# Patient Record
Sex: Female | Born: 1960 | Race: White | Hispanic: No | Marital: Single | Smoking: Never smoker
Health system: Southern US, Community
[De-identification: ages and names within clinical notes are randomized; demographics above are authoritative.]

## PROBLEM LIST (undated history)

## (undated) DIAGNOSIS — R319 Hematuria, unspecified: Secondary | ICD-10-CM

## (undated) DIAGNOSIS — R350 Frequency of micturition: Secondary | ICD-10-CM

## (undated) DIAGNOSIS — N950 Postmenopausal bleeding: Secondary | ICD-10-CM

## (undated) DIAGNOSIS — R232 Flushing: Secondary | ICD-10-CM

## (undated) DIAGNOSIS — E119 Type 2 diabetes mellitus without complications: Secondary | ICD-10-CM

## (undated) DIAGNOSIS — T7840XA Allergy, unspecified, initial encounter: Secondary | ICD-10-CM

## (undated) DIAGNOSIS — R569 Unspecified convulsions: Secondary | ICD-10-CM

## (undated) DIAGNOSIS — U071 COVID-19: Secondary | ICD-10-CM

## (undated) HISTORY — DX: COVID-19: U07.1

## (undated) HISTORY — DX: Postmenopausal bleeding: N95.0

## (undated) HISTORY — DX: Allergy, unspecified, initial encounter: T78.40XA

## (undated) HISTORY — PX: BLADDER SURGERY: SHX569

## (undated) HISTORY — DX: Frequency of micturition: R35.0

## (undated) HISTORY — DX: Hematuria, unspecified: R31.9

## (undated) HISTORY — DX: Flushing: R23.2

## (undated) HISTORY — PX: COLONOSCOPY: SHX174

## (undated) HISTORY — DX: Unspecified convulsions: R56.9

## (undated) HISTORY — PX: WISDOM TOOTH EXTRACTION: SHX21

## (undated) HISTORY — DX: Type 2 diabetes mellitus without complications: E11.9

## (undated) HISTORY — PX: TONSILLECTOMY: SUR1361

---

## 2001-03-05 ENCOUNTER — Other Ambulatory Visit: Admission: RE | Admit: 2001-03-05 | Discharge: 2001-03-05 | Payer: Self-pay | Admitting: Obstetrics and Gynecology

## 2007-08-05 ENCOUNTER — Other Ambulatory Visit: Admission: RE | Admit: 2007-08-05 | Discharge: 2007-08-05 | Payer: Self-pay | Admitting: Obstetrics and Gynecology

## 2007-08-13 ENCOUNTER — Ambulatory Visit (HOSPITAL_COMMUNITY): Admission: RE | Admit: 2007-08-13 | Discharge: 2007-08-13 | Payer: Self-pay | Admitting: Obstetrics and Gynecology

## 2008-02-05 ENCOUNTER — Emergency Department (HOSPITAL_COMMUNITY): Admission: EM | Admit: 2008-02-05 | Discharge: 2008-02-05 | Payer: Self-pay | Admitting: Emergency Medicine

## 2010-09-20 ENCOUNTER — Other Ambulatory Visit (HOSPITAL_COMMUNITY)
Admission: RE | Admit: 2010-09-20 | Discharge: 2010-09-20 | Disposition: A | Discharge status: Home / Self Care | Payer: BC Managed Care – PPO | Source: Ambulatory Visit | Attending: Obstetrics and Gynecology | Admitting: Obstetrics and Gynecology

## 2010-09-20 DIAGNOSIS — Z01419 Encounter for gynecological examination (general) (routine) without abnormal findings: Secondary | ICD-10-CM | POA: Insufficient documentation

## 2012-12-10 ENCOUNTER — Telehealth: Payer: Self-pay | Admitting: Obstetrics & Gynecology

## 2012-12-10 NOTE — Telephone Encounter (Signed)
Please call patient and tell her to bring her labs with her, but neither is a major issue.  We will address at that appointment.

## 2012-12-10 NOTE — Telephone Encounter (Signed)
Spoke with pt. Had labs done through work in Feb and just got results back. Has a physical scheduled with Dr. Despina Hidden July 24. Advised to bring the results with her to appt, but pt is anxious and wants to know if she can know something before then. Pt questioning ?GFR 71 factor and TSH elevated. What do you advise?

## 2012-12-10 NOTE — Telephone Encounter (Signed)
Left message telling pt to bring labs with her, and that neither is a major issue. Will discuss further at next appt. JSY

## 2012-12-25 ENCOUNTER — Encounter: Payer: Self-pay | Admitting: Obstetrics & Gynecology

## 2012-12-25 ENCOUNTER — Other Ambulatory Visit (HOSPITAL_COMMUNITY)
Admission: RE | Admit: 2012-12-25 | Discharge: 2012-12-25 | Disposition: A | Discharge status: Home / Self Care | Payer: BC Managed Care – PPO | Source: Ambulatory Visit | Attending: Obstetrics & Gynecology | Admitting: Obstetrics & Gynecology

## 2012-12-25 ENCOUNTER — Ambulatory Visit (INDEPENDENT_AMBULATORY_CARE_PROVIDER_SITE_OTHER): Payer: BC Managed Care – PPO | Admitting: Obstetrics & Gynecology

## 2012-12-25 VITALS — BP 90/60 | Ht 64.4 in | Wt 126.0 lb

## 2012-12-25 DIAGNOSIS — Z01419 Encounter for gynecological examination (general) (routine) without abnormal findings: Secondary | ICD-10-CM | POA: Insufficient documentation

## 2012-12-25 DIAGNOSIS — E039 Hypothyroidism, unspecified: Secondary | ICD-10-CM

## 2012-12-25 DIAGNOSIS — Z1151 Encounter for screening for human papillomavirus (HPV): Secondary | ICD-10-CM | POA: Insufficient documentation

## 2012-12-25 MED ORDER — ESTRADIOL 0.52 MG/0.87 GM (0.06%) TD GEL: 2.0000 "application " | Freq: Every day | TRANSDERMAL | Status: DC

## 2012-12-25 MED ORDER — MIRABEGRON ER 25 MG PO TB24: 25.0000 mg | ORAL_TABLET | Freq: Every day | ORAL | Status: DC

## 2012-12-25 MED ORDER — PROGESTERONE MICRONIZED 200 MG PO CAPS: ORAL_CAPSULE | ORAL | Status: DC

## 2012-12-25 NOTE — Addendum Note (Signed)
Addended by: Colen Darling on: 12/25/2012 03:33 PM   Modules accepted: Orders

## 2012-12-25 NOTE — Patient Instructions (Signed)
Mammogram [phone]  Colonoscopy A colonoscopy is an exam to evaluate your entire colon. In this exam, your colon is cleansed. A long fiberoptic tube is inserted through your rectum and into your colon. The fiberoptic scope (endoscope) is a long bundle of enclosed and very flexible fibers. These fibers transmit light to the area examined and send images from that area to your caregiver. Discomfort is usually minimal. You may be given a drug to help you sleep (sedative) during or prior to the procedure. This exam helps to detect lumps (tumors), polyps, inflammation, and areas of bleeding. Your caregiver may also take a small piece of tissue (biopsy) that will be examined under a microscope. LET YOUR CAREGIVER KNOW ABOUT:   Allergies to food or medicine.  Medicines taken, including vitamins, herbs, eyedrops, over-the-counter medicines, and creams.  Use of steroids (by mouth or creams).  Previous problems with anesthetics or numbing medicines.  History of bleeding problems or blood clots.  Previous surgery.  Other health problems, including diabetes and kidney problems.  Possibility of pregnancy, if this applies. BEFORE THE PROCEDURE   A clear liquid diet may be required for 2 days before the exam.  Ask your caregiver about changing or stopping your regular medications.  Liquid injections (enemas) or laxatives may be required.  A large amount of electrolyte solution may be given to you to drink over a short period of time. This solution is used to clean out your colon.  You should be present 60 minutes prior to your procedure or as directed by your caregiver. AFTER THE PROCEDURE   If you received a sedative or pain relieving medication, you will need to arrange for someone to drive you home.  Occasionally, there is a little blood passed with the first bowel movement. Do not be concerned. FINDING OUT THE RESULTS OF YOUR TEST Not all test results are available during your visit. If  your test results are not back during the visit, make an appointment with your caregiver to find out the results. Do not assume everything is normal if you have not heard from your caregiver or the medical facility. It is important for you to follow up on all of your test results. HOME CARE INSTRUCTIONS   It is not unusual to pass moderate amounts of gas and experience mild abdominal cramping following the procedure. This is due to air being used to inflate your colon during the exam. Walking or a warm pack on your belly (abdomen) may help.  You may resume all normal meals and activities after sedatives and medicines have worn off.  Only take over-the-counter or prescription medicines for pain, discomfort, or fever as directed by your caregiver. Do not use aspirin or blood thinners if a biopsy was taken. Consult your caregiver for medicine usage if biopsies were taken. SEEK IMMEDIATE MEDICAL CARE IF:   You have a fever.  You pass large blood clots or fill a toilet with blood following the procedure. This may also occur 10 to 14 days following the procedure. This is more likely if a biopsy was taken.  You develop abdominal pain that keeps getting worse and cannot be relieved with medicine. Document Released: 05/18/2000 Document Revised: 08/13/2011 Document Reviewed: 01/01/2008 [facility] Patient Information 2014 [place], [place].

## 2012-12-25 NOTE — Progress Notes (Signed)
Patient ID: Patricia Huerta, female   DOB: Sep 14, 1960, 52 y.o.   MRN: [id number] Subjective:     Patricia Huerta is a 52 y.o. female here for a routine exam.  No LMP recorded. Patient is postmenopausal. No obstetric history on file. Current complaints: perimenopausal symptoms.  Personal health questionnaire reviewed: no.   Gynecologic History No LMP recorded. Patient is postmenopausal. Contraception: none Last Pap: 2013. Results were: normal Last mammogram: 2009. Results were: normal  Obstetric HistoryG0P0 OB History   Grav Para Term Preterm Abortions TAB SAB Ect Mult Living                   The following portions of the patient's history were reviewed and updated as appropriate: allergies, current medications, past family history, past medical history, past social history, past surgical history and problem list.  Review of Systems  Review of Systems  Constitutional: Negative for fever, chills, weight loss, malaise/fatigue and diaphoresis.  HENT: Negative for hearing loss, ear pain, nosebleeds, congestion, sore throat, neck pain, tinnitus and ear discharge.   Eyes: Negative for blurred vision, double vision, photophobia, pain, discharge and redness.  Respiratory: Negative for cough, hemoptysis, sputum production, shortness of breath, wheezing and stridor.   Cardiovascular: Negative for chest pain, palpitations, orthopnea, claudication, leg swelling and PND.  Gastrointestinal: negative for abdominal pain. Negative for heartburn, nausea, vomiting, diarrhea, constipation, blood in stool and melena.  Genitourinary: Negative for dysuria, urgency, frequency, hematuria and flank pain.  Musculoskeletal: Negative for myalgias, back pain, joint pain and falls.  Skin: Negative for itching and rash.  Neurological: Negative for dizziness, tingling, tremors, sensory change, speech change, focal weakness, seizures, loss of consciousness, weakness and headaches.  Endo/Heme/Allergies: Negative for  environmental allergies and polydipsia. Does not bruise/bleed easily.  Psychiatric/Behavioral: Negative for depression, suicidal ideas, hallucinations, memory loss and substance abuse. The patient is not nervous/anxious and does not have insomnia.        Objective:    Physical Exam  Vitals reviewed. Constitutional: She is oriented to person, place, and time. She appears well-developed and well-nourished.  HENT:  Head: Normocephalic and atraumatic.        Right Ear: External ear normal.  Left Ear: External ear normal.  Nose: Nose normal.  Mouth/Throat: Oropharynx is clear and moist.  Eyes: Conjunctivae and EOM are normal. Pupils are equal, round, and reactive to light. Right eye exhibits no discharge. Left eye exhibits no discharge. No scleral icterus.  Neck: Normal range of motion. Neck supple. No tracheal deviation present. No thyromegaly present.  Cardiovascular: Normal rate, regular rhythm, normal heart sounds and intact distal pulses.  Exam reveals no gallop and no friction rub.   No murmur heard. Respiratory: Effort normal and breath sounds normal. No respiratory distress. She has no wheezes. She has no rales. She exhibits no tenderness.  GI: Soft. Bowel sounds are normal. She exhibits no distension and no mass. There is no tenderness. There is no rebound and no guarding.  Genitourinary:       Vulva is normal without lesions Vagina is pink moist without discharge Cervix normal in appearance and pap is done Uterus is normal size shape and contour Adnexa is negative with normal sized ovaries   Musculoskeletal: Normal range of motion. She exhibits no edema and no tenderness.  Neurological: She is alert and oriented to person, place, and time. She has normal reflexes. She displays normal reflexes. No cranial nerve deficit. She exhibits normal muscle tone. Coordination normal.  Skin: Skin  is warm and dry. No rash noted. No erythema. No pallor.  Psychiatric: She has a normal mood and  affect. Her behavior is normal. Judgment and thought content normal.       Assessment:    Healthy female exam.    Plan:    Hormone replacement therapy: risks and benefits reviewed. Mammogram ordered. Follow up in: 1 year. discussed use of Myrbetriq

## 2012-12-26 LAB — TSH: TSH: 2.903 u[IU]/mL (ref 0.350–4.500)

## 2014-04-14 ENCOUNTER — Telehealth: Payer: Self-pay | Admitting: Adult Health

## 2014-04-14 NOTE — Telephone Encounter (Signed)
Pt not had labs, but told her weight,height and BP

## 2014-07-27 ENCOUNTER — Other Ambulatory Visit (HOSPITAL_COMMUNITY)
Admission: RE | Admit: 2014-07-27 | Discharge: 2014-07-27 | Disposition: A | Discharge status: Home / Self Care | Payer: BC Managed Care – PPO | Source: Ambulatory Visit | Attending: Advanced Practice Midwife | Admitting: Advanced Practice Midwife

## 2014-07-27 ENCOUNTER — Ambulatory Visit (INDEPENDENT_AMBULATORY_CARE_PROVIDER_SITE_OTHER): Payer: BC Managed Care – PPO | Admitting: Advanced Practice Midwife

## 2014-07-27 ENCOUNTER — Other Ambulatory Visit: Payer: BC Managed Care – PPO

## 2014-07-27 ENCOUNTER — Encounter: Payer: Self-pay | Admitting: Advanced Practice Midwife

## 2014-07-27 VITALS — BP 114/70 | Ht 64.0 in | Wt 133.5 lb

## 2014-07-27 DIAGNOSIS — M545 Low back pain: Secondary | ICD-10-CM

## 2014-07-27 DIAGNOSIS — Z113 Encounter for screening for infections with a predominantly sexual mode of transmission: Secondary | ICD-10-CM | POA: Diagnosis present

## 2014-07-27 DIAGNOSIS — Z1329 Encounter for screening for other suspected endocrine disorder: Secondary | ICD-10-CM

## 2014-07-27 DIAGNOSIS — R319 Hematuria, unspecified: Secondary | ICD-10-CM

## 2014-07-27 DIAGNOSIS — E049 Nontoxic goiter, unspecified: Secondary | ICD-10-CM

## 2014-07-27 DIAGNOSIS — R1032 Left lower quadrant pain: Secondary | ICD-10-CM

## 2014-07-27 DIAGNOSIS — Z01419 Encounter for gynecological examination (general) (routine) without abnormal findings: Secondary | ICD-10-CM

## 2014-07-27 DIAGNOSIS — N72 Inflammatory disease of cervix uteri: Secondary | ICD-10-CM

## 2014-07-27 DIAGNOSIS — Z1151 Encounter for screening for human papillomavirus (HPV): Secondary | ICD-10-CM | POA: Diagnosis present

## 2014-07-27 DIAGNOSIS — Z131 Encounter for screening for diabetes mellitus: Secondary | ICD-10-CM

## 2014-07-27 DIAGNOSIS — N898 Other specified noninflammatory disorders of vagina: Secondary | ICD-10-CM

## 2014-07-27 DIAGNOSIS — Z13220 Encounter for screening for lipoid disorders: Secondary | ICD-10-CM

## 2014-07-27 LAB — POCT URINALYSIS DIPSTICK
GLUCOSE UA: NEGATIVE
KETONES UA: NEGATIVE
Leukocytes, UA: NEGATIVE
Nitrite, UA: NEGATIVE
Protein, UA: NEGATIVE
RBC UA: 3

## 2014-07-27 MED ORDER — METRONIDAZOLE 0.75 % VA GEL: 1.0000 | Freq: Two times a day (BID) | VAGINAL | Status: DC

## 2014-07-27 NOTE — Progress Notes (Addendum)
[facility] Visit  Patient name: Patricia Huerta MRN [id number]  Date of birth: 04/10/1961  CC & HPI:  Patricia Huerta is a 54 y.o. Caucasian female presenting today for pap and physical.  She has not had a mammogram in 3-4 years.  Has not had a colonoscopy.  Will have COBRA insurance after next week, unsure of coverage (will check with insurance company).   Pertinent History Reviewed:  Medical & Surgical Hx:   History reviewed. No pertinent past medical history. Past Surgical History  Procedure Laterality Date  . Wisdom tooth extraction     Family History  Problem Relation Age of Onset  . Colon cancer Father   . Hypertension Father   . Heart murmur Father   . Cancer Paternal Grandmother     colon    Current outpatient prescriptions:  .  flintstones complete (FLINTSTONES) 60 MG chewable tablet, Chew 1 tablet by mouth daily., Disp: , Rfl:  .  metroNIDAZOLE (METROGEL VAGINAL) 0.75 % vaginal gel, Place 1 Applicatorful vaginally 2 (two) times daily., Disp: 70 g, Rfl: 0 Social History: Reviewed -  reports that she has never smoked. She has never used smokeless tobacco.  Review of Systems:    Review of Systems   Constitutional: Negative for fever and chills Eyes: Negative for visual disturbances Respiratory: Negative for shortness of breath, dyspnea Cardiovascular: Negative palpitations.  POSITIVE for chest tenderness over sternum and to the left for "several months' Gastrointestinal: Negative for vomiting, diarrhea and constipation Abdominal:  C/O LLQ "fullness" for over a year. Tenderness is intermittent Genitourinary: Negative for dysuria and urgency Musculoskeletal: Negative for joint pain, myalgias.  POSITIVE for lower back pain for a few months, exacerbated by movement Neurological: Negative for dizziness and headaches    Objective Findings:  Vitals: BP 114/70 mmHg  Ht 5\' 4"  (1.626 m)  Wt 133 lb 8 oz (60.555 kg)  BMI 22.90 kg/m2  Physical Examination:  General appearance - alert, well appearing, and in no distress Mental status - alert, oriented to person, place, and time Neck - thyroid exam: thyroid enlarged, smooth, nontender, no palpable nodules Chest - clear to auscultation, no wheezes, rales or rhonchi, symmetric air entry Heart - normal rate and regular rhythm Abdomen - soft, nontender Breasts - breasts appear normal, no suspicious masses, no skin or nipple changes or axillary nodes Pelvic - normal external genitalia,  VULVA: normal appearing vulva with no masses, tenderness or lesions, VAGINA: thin yellow discharge without odor, WET PREP:  + WBC, no clue, trich, or yeast. CERVIX: normal appearing cervix without discharge or lesions, friable , UTERUS: uterus is normal size, shape, consistency and nontender, ADNEXA: tenderness left Rectal - normal rectal, no masses, guaiac negative stool obtained Back exam - some point tenderness lower back along muscles Musculoskeletal - no joint tenderness, deformity or swelling Extremities - no pedal edema noted Skin - normal coloration and turgor, no rashes, no suspicious skin lesions noted  Results for orders placed or performed in visit on 07/27/14 (from the past 24 hour(s))  POCT urinalysis dipstick   Collection Time: 07/27/14  9:10 AM  Result Value Ref Range   Color, UA     Clarity, UA     Glucose, UA neg    Bilirubin, UA     Ketones, UA neg    Spec Grav, UA     Blood, UA 3    pH, UA     Protein, UA neg    Urobilinogen, UA  Nitrite, UA neg    Leukocytes, UA Negative      20 minutes spent talking face to face.     Assessment & Plan:  A:   Essentially Normal well woman exam  Enlarge thyroid  Cervicitis with leukorrhea  Chest wall tenderness, ? Origin  Lower back musculoskeletal pain  Hematuria, ? From vagina P:  PAP with HPV.  If negative, repeat 3 years  mammogram  GC/CHL  Urine C&S  TSH, CBC, CMP, Hgb A1C, Lipid panel  Pelvic US Thursday  RX: metrogel qhs X5  See  PCP for chest wall pain/referral  Referral to GI for screening colonoscopy  Screening mammogram strongly recommended  CRESENZO-DISHMAN,Deeric Cruise CNM 07/27/2014 9:32 AM    07/28/2014 12:41 PM TSH normal.  Ordered thyroid US

## 2014-07-28 LAB — CBC
HEMATOCRIT: 41.1 % (ref 34.0–46.6)
Hemoglobin: 13.8 g/dL (ref 11.1–15.9)
MCH: 30.2 pg (ref 26.6–33.0)
MCHC: 33.6 g/dL (ref 31.5–35.7)
MCV: 90 fL (ref 79–97)
Platelets: 302 10*3/uL (ref 150–379)
RBC: 4.57 x10E6/uL (ref 3.77–5.28)
RDW: 12.9 % (ref 12.3–15.4)
WBC: 4.8 10*3/uL (ref 3.4–10.8)

## 2014-07-28 LAB — COMPREHENSIVE METABOLIC PANEL
ALT: 21 IU/L (ref 0–32)
AST: 23 IU/L (ref 0–40)
Albumin/Globulin Ratio: 1.8 (ref 1.1–2.5)
Albumin: 4.6 g/dL (ref 3.5–5.5)
Alkaline Phosphatase: 98 IU/L (ref 39–117)
BUN/Creatinine Ratio: 16 (ref 9–23)
BUN: 16 mg/dL (ref 6–24)
Bilirubin Total: 0.6 mg/dL (ref 0.0–1.2)
CALCIUM: 9.9 mg/dL (ref 8.7–10.2)
CHLORIDE: 97 mmol/L (ref 97–108)
CO2: 25 mmol/L (ref 18–29)
CREATININE: 0.98 mg/dL (ref 0.57–1.00)
GFR calc Af Amer: 77 mL/min/{1.73_m2} (ref >59)
GFR, EST NON AFRICAN AMERICAN: 67 mL/min/{1.73_m2} (ref >59)
GLUCOSE: 94 mg/dL (ref 65–99)
Globulin, Total: 2.6 g/dL (ref 1.5–4.5)
POTASSIUM: 4.1 mmol/L (ref 3.5–5.2)
Sodium: 140 mmol/L (ref 134–144)
Total Protein: 7.2 g/dL (ref 6.0–8.5)

## 2014-07-28 LAB — URINE CULTURE: ORGANISM ID, BACTERIA: NO GROWTH

## 2014-07-28 LAB — HEMOGLOBIN A1C
Est. average glucose Bld gHb Est-mCnc: 126 mg/dL
Hgb A1c MFr Bld: 6 % — ABNORMAL HIGH (ref 4.8–5.6)

## 2014-07-28 LAB — LIPID PANEL
CHOL/HDL RATIO: 2 ratio (ref 0.0–4.4)
Cholesterol, Total: 196 mg/dL (ref 100–199)
HDL: 99 mg/dL (ref >39)
LDL Calculated: 87 mg/dL (ref 0–99)
Triglycerides: 50 mg/dL (ref 0–149)
VLDL Cholesterol Cal: 10 mg/dL (ref 5–40)

## 2014-07-28 LAB — TSH: TSH: 3.33 u[IU]/mL (ref 0.450–4.500)

## 2014-07-28 NOTE — Addendum Note (Signed)
Addended by: Jacklyn ShellRESENZO-DISHMON, Maddelynn Moosman on: 07/28/2014 12:41 PM   Modules accepted: Orders

## 2014-07-29 ENCOUNTER — Ambulatory Visit (INDEPENDENT_AMBULATORY_CARE_PROVIDER_SITE_OTHER): Payer: BC Managed Care – PPO

## 2014-07-29 DIAGNOSIS — R1032 Left lower quadrant pain: Secondary | ICD-10-CM

## 2014-07-29 LAB — CYTOLOGY - PAP

## 2015-03-08 ENCOUNTER — Encounter: Payer: Self-pay | Admitting: Adult Health

## 2015-03-08 ENCOUNTER — Ambulatory Visit (INDEPENDENT_AMBULATORY_CARE_PROVIDER_SITE_OTHER): Payer: BC Managed Care – PPO | Admitting: Adult Health

## 2015-03-08 VITALS — BP 100/60 | HR 52 | Ht 65.0 in | Wt 125.5 lb

## 2015-03-08 DIAGNOSIS — N950 Postmenopausal bleeding: Secondary | ICD-10-CM

## 2015-03-08 DIAGNOSIS — R319 Hematuria, unspecified: Secondary | ICD-10-CM

## 2015-03-08 DIAGNOSIS — N951 Menopausal and female climacteric states: Secondary | ICD-10-CM | POA: Diagnosis not present

## 2015-03-08 DIAGNOSIS — R350 Frequency of micturition: Secondary | ICD-10-CM

## 2015-03-08 DIAGNOSIS — Z139 Encounter for screening, unspecified: Secondary | ICD-10-CM

## 2015-03-08 DIAGNOSIS — R232 Flushing: Secondary | ICD-10-CM

## 2015-03-08 HISTORY — DX: Frequency of micturition: R35.0

## 2015-03-08 HISTORY — DX: Flushing: R23.2

## 2015-03-08 HISTORY — DX: Hematuria, unspecified: R31.9

## 2015-03-08 HISTORY — DX: Postmenopausal bleeding: N95.0

## 2015-03-08 LAB — POCT URINALYSIS DIPSTICK: Glucose, UA: NEGATIVE

## 2015-03-08 MED ORDER — FLUCONAZOLE 150 MG PO TABS: 150.0000 mg | ORAL_TABLET | Freq: Once | ORAL | Status: DC

## 2015-03-08 MED ORDER — SULFAMETHOXAZOLE-TRIMETHOPRIM 800-160 MG PO TABS: 1.0000 | ORAL_TABLET | Freq: Two times a day (BID) | ORAL | Status: DC

## 2015-03-08 NOTE — Progress Notes (Signed)
Subjective:     Patient ID: Patricia Huerta, female   DOB: 1960/12/08, 54 y.o.   MRN: [id number]  HPI Patricia Huerta is a 54 year old white female, married in complaining of urinary frequency and some UI.She is having hot flashes and has not had period in 2 years but spots often and has low back pain.She had normal [place] in February with 1.9 mm endometrium.  Review of Systems Patient denies any headaches, hearing loss, fatigue, blurred vision, shortness of breath, chest pain, abdominal pain, problems with bowel movements,  or intercourse. No joint pain or mood swings.See HPI for positives.  Reviewed past medical,surgical, social and family history. Reviewed medications and allergies.     Objective:   Physical Exam BP 100/60 mmHg  Pulse 52  Ht  (1.651 m)  Wt 125 lb 8 oz (56.926 kg)  BMI 20.88 kg/m2   urine 2+ leuks,trace protein, 1+blood,Skin warm and dry.Pelvic: external genitalia is normal in appearance no lesions, vagina: pale with loss of moisture and rugae,urethra has no lesions or masses noted, cervix is tiny, uterus: normal size, shape and contour, non tender, no masses felt, adnexa: no masses or tenderness noted. Bladder is non tender and no masses felt. No CVAT.  Assessment:     Urinary frequency  Hematuria Hot flashes ?PMB    Plan:    Push fluids Rx septra ds 1 bid x 7 days  Rx diflucan 150 mg #1 take 1 now with 1 refills UA C&S sent Check FSH and A1c Will talk when labs back if [facility] elevated will get [place]

## 2015-03-08 NOTE — Patient Instructions (Signed)
Push fluids Take septra ds  Get labs tomorrow

## 2015-03-10 LAB — MICROSCOPIC EXAMINATION
Casts: NONE SEEN /lpf
Epithelial Cells (non renal): NONE SEEN /hpf (ref 0–10)

## 2015-03-10 LAB — URINALYSIS, ROUTINE W REFLEX MICROSCOPIC
Bilirubin, UA: NEGATIVE
GLUCOSE, UA: NEGATIVE
Ketones, UA: NEGATIVE
Nitrite, UA: NEGATIVE
Specific Gravity, UA: 1.018 (ref 1.005–1.030)
UUROB: 0.2 mg/dL (ref 0.2–1.0)
pH, UA: 8 — ABNORMAL HIGH (ref 5.0–7.5)

## 2015-03-11 LAB — URINE CULTURE

## 2015-04-20 ENCOUNTER — Telehealth: Payer: Self-pay | Admitting: Adult Health

## 2015-04-20 NOTE — Telephone Encounter (Signed)
Left message that she has not had lab done yet

## 2015-07-27 ENCOUNTER — Telehealth: Payer: Self-pay | Admitting: Adult Health

## 2015-07-27 ENCOUNTER — Telehealth: Payer: Self-pay | Admitting: *Deleted

## 2015-07-27 NOTE — Telephone Encounter (Signed)
Pt called this am with a different telephone encounter about labs. Note was sent to JAG. JSY

## 2015-07-27 NOTE — Telephone Encounter (Signed)
Has body aches, all the time,make appt

## 2015-07-28 LAB — HGB A1C W/O EAG: Hgb A1c MFr Bld: 6.1 % — ABNORMAL HIGH (ref 4.8–5.6)

## 2015-07-28 LAB — FOLLICLE STIMULATING HORMONE: FSH: 108 m[IU]/mL

## 2015-07-29 ENCOUNTER — Telehealth: Payer: Self-pay | Admitting: Adult Health

## 2015-07-29 NOTE — Telephone Encounter (Signed)
Left message about labs, she is PM,so if any bleeding will need [place] and is pre diabetic, need to decrease carbs

## 2015-08-02 ENCOUNTER — Telehealth: Payer: Self-pay | Admitting: Adult Health

## 2015-08-02 NOTE — Telephone Encounter (Signed)
Pt needs gyn [place], discussed diet for elevated A1c. [place] 3/14 at 4pm

## 2015-08-03 ENCOUNTER — Telehealth: Payer: Self-pay | Admitting: Adult Health

## 2015-08-03 NOTE — Telephone Encounter (Signed)
Pt aware and confirmed [place] 3/14 at 4 pm

## 2015-08-15 ENCOUNTER — Other Ambulatory Visit: Payer: Self-pay | Admitting: Adult Health

## 2015-08-15 DIAGNOSIS — N950 Postmenopausal bleeding: Secondary | ICD-10-CM

## 2015-08-16 ENCOUNTER — Ambulatory Visit (INDEPENDENT_AMBULATORY_CARE_PROVIDER_SITE_OTHER): Payer: BC Managed Care – PPO

## 2015-08-16 DIAGNOSIS — N950 Postmenopausal bleeding: Secondary | ICD-10-CM

## 2015-08-16 NOTE — Progress Notes (Signed)
US PELVIC TA/TV: normal homogenous anteverted uterus,normal ov's bilat (mobile),EEC 4 mm,no free fluid,no pain during ultrasound

## 2015-08-18 ENCOUNTER — Telehealth: Payer: Self-pay | Admitting: Adult Health

## 2015-08-18 NOTE — Telephone Encounter (Signed)
Left message that US was normal ?

## 2016-01-27 ENCOUNTER — Encounter: Payer: Self-pay | Admitting: Internal Medicine

## 2016-01-27 ENCOUNTER — Other Ambulatory Visit: Payer: Self-pay | Admitting: Obstetrics and Gynecology

## 2016-01-27 DIAGNOSIS — Z1231 Encounter for screening mammogram for malignant neoplasm of breast: Secondary | ICD-10-CM

## 2016-01-31 ENCOUNTER — Ambulatory Visit (INDEPENDENT_AMBULATORY_CARE_PROVIDER_SITE_OTHER): Payer: BC Managed Care – PPO | Admitting: Obstetrics & Gynecology

## 2016-01-31 ENCOUNTER — Encounter: Payer: Self-pay | Admitting: Obstetrics & Gynecology

## 2016-01-31 ENCOUNTER — Other Ambulatory Visit (HOSPITAL_COMMUNITY)
Admission: RE | Admit: 2016-01-31 | Discharge: 2016-01-31 | Disposition: A | Discharge status: Home / Self Care | Payer: BC Managed Care – PPO | Source: Ambulatory Visit | Attending: Obstetrics & Gynecology | Admitting: Obstetrics & Gynecology

## 2016-01-31 VITALS — BP 90/60 | HR 84 | Ht 65.0 in | Wt 122.0 lb

## 2016-01-31 DIAGNOSIS — R739 Hyperglycemia, unspecified: Secondary | ICD-10-CM

## 2016-01-31 DIAGNOSIS — R5383 Other fatigue: Secondary | ICD-10-CM

## 2016-01-31 DIAGNOSIS — Z1151 Encounter for screening for human papillomavirus (HPV): Secondary | ICD-10-CM | POA: Diagnosis present

## 2016-01-31 DIAGNOSIS — Z01419 Encounter for gynecological examination (general) (routine) without abnormal findings: Secondary | ICD-10-CM | POA: Diagnosis not present

## 2016-01-31 DIAGNOSIS — E7800 Pure hypercholesterolemia, unspecified: Secondary | ICD-10-CM

## 2016-01-31 MED ORDER — ESTRADIOL 0.1 MG/GM VA CREA: 1.0000 | TOPICAL_CREAM | Freq: Every day | VAGINAL | 12 refills | Status: DC

## 2016-01-31 MED ORDER — OMEPRAZOLE 20 MG PO CPDR: 20.0000 mg | DELAYED_RELEASE_CAPSULE | Freq: Every day | ORAL | 6 refills | Status: DC

## 2016-01-31 NOTE — Progress Notes (Signed)
Subjective:     Patricia Huerta is a 55 y.o. female here for a routine exam.  No LMP recorded. Patient is postmenopausal. G0P0000 Birth Control Method:  Post menopausal Menstrual Calendar(currently): amenorrheic  Current complaints: difficulty with sleep, some vasomotor symptoms.   Current acute medical issues:  none   Recent Gynecologic History No LMP recorded. Patient is postmenopausal. Last Pap: 2015,  normal Last mammogram: 2009,  normal  Outpatient Encounter Prescriptions as of 01/31/2016  Medication Sig  . estradiol (ESTRACE VAGINAL) 0.1 MG/GM vaginal cream Place 1 Applicatorful vaginally at bedtime.  . flintstones complete (FLINTSTONES) 60 MG chewable tablet Chew 1 tablet by mouth daily.  . fluconazole (DIFLUCAN) 150 MG tablet Take 1 tablet (150 mg total) by mouth once.  Marland Kitchen. omeprazole (PRILOSEC) 20 MG capsule Take 1 capsule (20 mg total) by mouth daily. 1 tablet a day  . sulfamethoxazole-trimethoprim (BACTRIM DS,SEPTRA DS) 800-160 MG tablet Take 1 tablet by mouth 2 (two) times daily.   No facility-administered encounter medications on file as of 01/31/2016.     Past Medical History:  Diagnosis Date  . Hematuria 03/08/2015  . Hot flashes 03/08/2015  . PMB (postmenopausal bleeding) 03/08/2015  . Urinary frequency 03/08/2015    Past Surgical History:  Procedure Laterality Date  . BLADDER SURGERY     bladder was stretched  . WISDOM TOOTH EXTRACTION      OB History    Gravida Para Term Preterm AB Living   0 0 0 0 0 0   SAB TAB Ectopic Multiple Live Births   0 0 0 0        Social History   Social History  . Marital status: Single    Spouse name: N/A  . Number of children: N/A  . Years of education: N/A   Social History Main Topics  . Smoking status: Never Smoker  . Smokeless tobacco: Never Used  . Alcohol use 1.2 oz/week    2 Glasses of wine per week     Comment: occ  . Drug use: No  . Sexual activity: Yes    Birth control/ protection: Post-menopausal    Other Topics Concern  . None   Social History Narrative  . None    Family History  Problem Relation Age of Onset  . Colon cancer Father   . Other Father     leaky heart valve  . Cancer Paternal Grandmother     colon, pancreatic     Current Outpatient Prescriptions:  .  estradiol (ESTRACE VAGINAL) 0.1 MG/GM vaginal cream, Place 1 Applicatorful vaginally at bedtime., Disp: 42.5 g, Rfl: 12 .  flintstones complete (FLINTSTONES) 60 MG chewable tablet, Chew 1 tablet by mouth daily., Disp: , Rfl:  .  fluconazole (DIFLUCAN) 150 MG tablet, Take 1 tablet (150 mg total) by mouth once., Disp: 1 tablet, Rfl: 1 .  omeprazole (PRILOSEC) 20 MG capsule, Take 1 capsule (20 mg total) by mouth daily. 1 tablet a day, Disp: 30 capsule, Rfl: 6 .  sulfamethoxazole-trimethoprim (BACTRIM DS,SEPTRA DS) 800-160 MG tablet, Take 1 tablet by mouth 2 (two) times daily., Disp: 14 tablet, Rfl: 0  Review of Systems  Review of Systems  Constitutional: Negative for fever, chills, weight loss, malaise/fatigue and diaphoresis.  HENT: Negative for hearing loss, ear pain, nosebleeds, congestion, sore throat, neck pain, tinnitus and ear discharge.   Eyes: Negative for blurred vision, double vision, photophobia, pain, discharge and redness.  Respiratory: Negative for cough, hemoptysis, sputum production, shortness of breath, wheezing and  stridor.   Cardiovascular: Negative for chest pain, palpitations, orthopnea, claudication, leg swelling and PND.  Gastrointestinal: negative for abdominal pain. Negative for heartburn, nausea, vomiting, diarrhea, constipation, blood in stool and melena.  Genitourinary: Negative for dysuria, urgency, frequency, hematuria and flank pain.  Musculoskeletal: Negative for myalgias, back pain, joint pain and falls.  Skin: Negative for itching and rash.  Neurological: Negative for dizziness, tingling, tremors, sensory change, speech change, focal weakness, seizures, loss of consciousness,  weakness and headaches.  Endo/Heme/Allergies: Negative for environmental allergies and polydipsia. Does not bruise/bleed easily.  Psychiatric/Behavioral: Negative for depression, suicidal ideas, hallucinations, memory loss and substance abuse. The patient is not nervous/anxious and does not have insomnia.        Objective:  Blood pressure 90/60, pulse 84, height 5\' 5"  (1.651 m), weight 122 lb (55.3 kg).   Physical Exam  Vitals reviewed. Constitutional: She is oriented to person, place, and time. She appears well-developed and well-nourished.  HENT:  Head: Normocephalic and atraumatic.        Right Ear: External ear normal.  Left Ear: External ear normal.  Nose: Nose normal.  Mouth/Throat: Oropharynx is clear and moist.  Eyes: Conjunctivae and EOM are normal. Pupils are equal, round, and reactive to light. Right eye exhibits no discharge. Left eye exhibits no discharge. No scleral icterus.  Neck: Normal range of motion. Neck supple. No tracheal deviation present. No thyromegaly present.  Cardiovascular: Normal rate, regular rhythm, normal heart sounds and intact distal pulses.  Exam reveals no gallop and no friction rub.   No murmur heard. Respiratory: Effort normal and breath sounds normal. No respiratory distress. She has no wheezes. She has no rales. She exhibits no tenderness.  GI: Soft. Bowel sounds are normal. She exhibits no distension and no mass. There is no tenderness. There is no rebound and no guarding.  Genitourinary:  Breasts no masses skin changes or nipple changes bilaterally      Vulva is normal without lesions Vagina is pink atrophic without discharge Cervix normal in appearance and pap is done Uterus is normal size shape and contour Adnexa is negative with normal sized ovaries  {Rectal    hemoccult negative, normal tone, no masses  Musculoskeletal: Normal range of motion. She exhibits no edema and no tenderness.  Neurological: She is alert and oriented to person,  place, and time. She has normal reflexes. She displays normal reflexes. No cranial nerve deficit. She exhibits normal muscle tone. Coordination normal.  Skin: Skin is warm and dry. No rash noted. No erythema. No pallor.  Psychiatric: She has a normal mood and affect. Her behavior is normal. Judgment and thought content normal.       Medications Ordered at today's visit: Meds ordered this encounter  Medications  . estradiol (ESTRACE VAGINAL) 0.1 MG/GM vaginal cream    Sig: Place 1 Applicatorful vaginally at bedtime.    Dispense:  42.5 g    Refill:  12  . omeprazole (PRILOSEC) 20 MG capsule    Sig: Take 1 capsule (20 mg total) by mouth daily. 1 tablet a day    Dispense:  30 capsule    Refill:  6    Other orders placed at today's visit: Orders Placed This Encounter  Procedures  . TSH  . HgB A1c  . Lipid Profile      Assessment:    Healthy female exam.    Plan:    Mammogram ordered. Follow up in: 1 year.    Meds ordered this encounter  Medications  . estradiol (ESTRACE VAGINAL) 0.1 MG/GM vaginal cream    Sig: Place 1 Applicatorful vaginally at bedtime.    Dispense:  42.5 g    Refill:  12  . omeprazole (PRILOSEC) 20 MG capsule    Sig: Take 1 capsule (20 mg total) by mouth daily. 1 tablet a day    Dispense:  30 capsule    Refill:  6      Return in about 1 year (around 01/30/2017) for with Dr Despina Hidden.

## 2016-02-02 LAB — CYTOLOGY - PAP

## 2016-02-13 ENCOUNTER — Ambulatory Visit (HOSPITAL_COMMUNITY): Payer: BC Managed Care – PPO

## 2016-03-21 LAB — LIPID PANEL
CHOL/HDL RATIO: 2 ratio (ref 0.0–4.4)
Cholesterol, Total: 221 mg/dL — ABNORMAL HIGH (ref 100–199)
HDL: 109 mg/dL (ref >39)
LDL CALC: 102 mg/dL — AB (ref 0–99)
Triglycerides: 50 mg/dL (ref 0–149)
VLDL CHOLESTEROL CAL: 10 mg/dL (ref 5–40)

## 2016-03-21 LAB — TSH: TSH: 4.37 u[IU]/mL (ref 0.450–4.500)

## 2016-03-21 LAB — HEMOGLOBIN A1C
Est. average glucose Bld gHb Est-mCnc: 120 mg/dL
HEMOGLOBIN A1C: 5.8 % — AB (ref 4.8–5.6)

## 2016-03-26 ENCOUNTER — Ambulatory Visit (AMBULATORY_SURGERY_CENTER): Payer: Self-pay

## 2016-03-26 VITALS — Ht 65.0 in | Wt 121.0 lb

## 2016-03-26 DIAGNOSIS — Z800 Family history of malignant neoplasm of digestive organs: Secondary | ICD-10-CM

## 2016-03-26 NOTE — Progress Notes (Signed)
No allergies to eggs or soy No past problems with anesthesia No diet meds No home oxygen  Registered emmi 

## 2016-04-02 ENCOUNTER — Ambulatory Visit (HOSPITAL_COMMUNITY): Payer: BC Managed Care – PPO

## 2016-04-09 ENCOUNTER — Encounter: Payer: Self-pay | Admitting: Internal Medicine

## 2016-04-09 ENCOUNTER — Ambulatory Visit (AMBULATORY_SURGERY_CENTER): Payer: BC Managed Care – PPO | Admitting: Internal Medicine

## 2016-04-09 VITALS — BP 90/56 | HR 57 | Temp 97.5°F | Resp 13 | Ht 65.0 in | Wt 121.0 lb

## 2016-04-09 DIAGNOSIS — Z800 Family history of malignant neoplasm of digestive organs: Secondary | ICD-10-CM

## 2016-04-09 DIAGNOSIS — Z1211 Encounter for screening for malignant neoplasm of colon: Secondary | ICD-10-CM

## 2016-04-09 DIAGNOSIS — Z1212 Encounter for screening for malignant neoplasm of rectum: Secondary | ICD-10-CM

## 2016-04-09 MED ORDER — SODIUM CHLORIDE 0.9 % IV SOLN: 500.0000 mL | INTRAVENOUS | Status: DC

## 2016-04-09 NOTE — Patient Instructions (Addendum)
   Normal colonoscopy today!  Your next routine colonoscopy should be in 5 years - 2022.  I appreciate the opportunity to care for you. Iva Booparl E. Tanaysha Alkins, MD, FACG     YOU HAD AN ENDOSCOPIC PROCEDURE TODAY AT THE [facility]:   Refer to the procedure report that was given to you for any specific questions about what was found during the examination.  If the procedure report does not answer your questions, please call your gastroenterologist to clarify.  If you requested that your care partner not be given the details of your procedure findings, then the procedure report has been included in a sealed envelope for you to review at your convenience later.  YOU SHOULD EXPECT: Some feelings of bloating in the abdomen. Passage of more gas than usual.  Walking can help get rid of the air that was put into your GI tract during the procedure and reduce the bloating. If you had a lower endoscopy (such as a colonoscopy or flexible sigmoidoscopy) you may notice spotting of blood in your stool or on the toilet paper. If you underwent a bowel prep for your procedure, you may not have a normal bowel movement for a few days.  Please Note:  You might notice some irritation and congestion in your nose or some drainage.  This is from the oxygen used during your procedure.  There is no need for concern and it should clear up in a day or so.  SYMPTOMS TO REPORT IMMEDIATELY:   Following lower endoscopy (colonoscopy or flexible sigmoidoscopy):  Excessive amounts of blood in the stool  Significant tenderness or worsening of abdominal pains  Swelling of the abdomen that is new, acute  Fever of 100F or higher    For urgent or emergent issues, a gastroenterologist can be reached at any hour by calling [phone].   DIET:  We do recommend a small meal at first, but then you may proceed to your regular diet.  Drink plenty of fluids but you should avoid alcoholic beverages for 24  hours.  ACTIVITY:  You should plan to take it easy for the rest of today and you should NOT DRIVE or use heavy machinery until tomorrow (because of the sedation medicines used during the test).    FOLLOW UP: Our staff will call the number listed on your records the next business day following your procedure to check on you and address any questions or concerns that you may have regarding the information given to you following your procedure. If we do not reach you, we will leave a message.  However, if you are feeling well and you are not experiencing any problems, there is no need to return our call.  We will assume that you have returned to your regular daily activities without incident.  If any biopsies were taken you will be contacted by phone or by letter within the next 1-3 weeks.  Please call us at [phone] if you have not heard about the biopsies in 3 weeks.    SIGNATURES/CONFIDENTIALITY: You and/or your care partner have signed paperwork which will be entered into your electronic medical record.  These signatures attest to the fact that that the information above on your After Visit Summary has been reviewed and is understood.  Full responsibility of the confidentiality of this discharge information lies with you and/or your care-partner.   Resume medications.

## 2016-04-09 NOTE — Progress Notes (Signed)
A/ox3, pleased with MAC, report to RN 

## 2016-04-09 NOTE — Op Note (Signed)
[facility] Patient Name: Patricia Huerta Procedure Date: 04/09/2016 11:27 AM MRN: [id number] Endoscopist: Iva Booparl E Gessner , MD Age: [age] Referring MD:  Date of Birth: [birth year] Gender: Female Account #: [id number] Procedure:                Colonoscopy Indications:              Screening in patient at increased risk: Colorectal                            cancer in father 4760 or older, Colon cancer                            screening in patient at increased risk: Family                            history of colorectal cancer in multiple 2nd degree                            relatives Medicines:                Propofol per Anesthesia, Monitored Anesthesia Care Procedure:                Pre-Anesthesia Assessment:                           - Prior to the procedure, a History and Physical                            was performed, and patient medications and                            allergies were reviewed. The patient's tolerance of                            previous anesthesia was also reviewed. The risks                            and benefits of the procedure and the sedation                            options and risks were discussed with the patient.                            All questions were answered, and informed consent                            was obtained. Prior Anticoagulants: The patient has                            taken no previous anticoagulant or antiplatelet                            agents. ASA Grade Assessment: II - A patient with  mild systemic disease. After reviewing the risks                            and benefits, the patient was deemed in                            satisfactory condition to undergo the procedure.                           After obtaining informed consent, the colonoscope                            was passed under direct vision. Throughout the                            procedure, the patient's blood  pressure, pulse, and                            oxygen saturations were monitored continuously. The                            Model CF-HQ190L [id number]) scope was introduced                            through the anus and advanced to the the cecum,                            identified by appendiceal orifice and ileocecal                            valve. The colonoscopy was performed without                            difficulty. The patient tolerated the procedure                            well. The quality of the bowel preparation was                            good. The bowel preparation used was Miralax. The                            ileocecal valve, appendiceal orifice, and rectum                            were photographed. Scope In: 11:38:58 AM Scope Out: 11:52:16 AM Scope Withdrawal Time: 0 hours 10 minutes 5 seconds  Total Procedure Duration: 0 hours 13 minutes 18 seconds  Findings:                 The perianal and digital rectal examinations were                            normal.  The colon (entire examined portion) appeared normal.                           No additional abnormalities were found on                            retroflexion. Complications:            No immediate complications. Estimated Blood Loss:     Estimated blood loss: none. Impression:               - The entire examined colon is normal.                           - No specimens collected. Recommendation:           - Patient has a contact number available for                            emergencies. The signs and symptoms of potential                            delayed complications were discussed with the                            patient. Return to normal activities tomorrow.                            Written discharge instructions were provided to the                            patient.                           - Resume previous diet.                           -  Continue present medications.                           - Repeat colonoscopy in 5 years for screening                            purposes. Father, paternal grandmother and a cousin                            w/ CRCA Iva BoopCarl E Gessner, MD 04/09/2016 11:58:22 AM This report has been signed electronically.

## 2016-04-10 ENCOUNTER — Telehealth: Payer: Self-pay

## 2016-04-10 NOTE — Telephone Encounter (Signed)
  Follow up Call-  Call back number 04/09/2016 04/09/2016  Post procedure Call Back phone  # #[phone] cell #  Permission to leave phone message Yes -  Some recent data might be hidden    Patient was called for follow up after her procedure on 04/09/2016. No answer at the number given for follow up phone call. A message was left on the answering machine.  

## 2016-04-10 NOTE — Telephone Encounter (Signed)
  Follow up Call-  Call back number 04/09/2016 04/09/2016  Post procedure Call Back phone  # #[phone] cell #  Permission to leave phone message Yes -  Some recent data might be hidden    Patient was called for follow up after her procedure on 04/09/2016. No answer at the number given for follow up phone call. A message was left on the answering machine.

## 2016-08-22 ENCOUNTER — Encounter: Payer: Self-pay | Admitting: Sports Medicine

## 2016-08-22 ENCOUNTER — Ambulatory Visit (INDEPENDENT_AMBULATORY_CARE_PROVIDER_SITE_OTHER): Payer: BC Managed Care – PPO | Admitting: Sports Medicine

## 2016-08-22 VITALS — BP 100/60 | HR 89 | Ht 65.0 in | Wt 126.0 lb

## 2016-08-22 DIAGNOSIS — S060X0A Concussion without loss of consciousness, initial encounter: Secondary | ICD-10-CM | POA: Diagnosis not present

## 2016-08-22 DIAGNOSIS — M9908 Segmental and somatic dysfunction of rib cage: Secondary | ICD-10-CM | POA: Diagnosis not present

## 2016-08-22 DIAGNOSIS — M9900 Segmental and somatic dysfunction of head region: Secondary | ICD-10-CM | POA: Diagnosis not present

## 2016-08-22 DIAGNOSIS — M9901 Segmental and somatic dysfunction of cervical region: Secondary | ICD-10-CM

## 2016-08-22 NOTE — Progress Notes (Signed)
Patricia Huerta - 56 y.o. female MRN [id number]  Date of birth: [birth year]  Office Visit Note: Visit Date: 08/22/2016 PCP: Lazaro Arms, MD Referred by: Lazaro Arms, MD  Subjective: Chief Complaint  Patient presents with  . 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 C/o fogginess, nausea, no appetite, headaches, flushed, and fatigued. Denies any vomiting, numbness, or confusion. In high school pt did have a seizure and hit her head--no sx since then.  No surgeries. Mom and dad do have HX of siezures.   HPI:  SCAT5 Concussion Hx Questionaire  Context of Concussion Event: Date/Time of Injury: 08/21/16  Context of Injury: Uppercut Mechanism from 56y/o special needs student jumping up while at school caring for her students  Sport/Team/School: [employer]  Education Completed:   Dominant Hand: Right handed  Past # of Concussions: None  Date of  concussion: n/a  Length of last recovery:  n/a  Pertinent Hx: Hosp/Imaging of Head No   Headaches/Migraines No  LD, Dyslexia, ADHD No  Depression/anxiety/other  No  Family Hx of Psych: No  Medications: Medication Sig  flintstones complete (FLINTSTONES) 60 MG chewable tablet Chew 1 tablet by mouth daily.       SYMPTOM SCORE: Sx 08/22/16   Total: 18/22 55/132  Worse Physical Activity n/a  Worse Mental Activity N/a    ROS: per Symptom score  Otherwise per HPI.  Objective:  VS:  HT:5\' 5"  (165.1 cm)   WT:126 lb (57.2 kg)  BMI:21    BP:100/60  HR:89bpm  TEMP: ( )  RESP:95 % Physical Exam: GENERAL:  WDWN, NAD, Non-toxic appearing PSYCH:  Alert & appropriately interactive  Not depressed or anxious appearing    NEUROLOGIC EXAM: Gross Deficits: None  Speech: None  Gait: Normal  Cerebellar: Normal EOM, No nystagmus  CN: II-XI intact  Strength: No gross deficits  Sensation: Intact to light touch       SCAT5  08/22/16   ORIENTATION    Month Yes   Date Yes   Day Yes   Year Yes   Time Yes   Total 5 /5       MEMORY    Immediate Memory #1 5 /5   Immediate Memory #2 5 /5   Immediate Memory #3 5 /5   Total 5 /15           CONCENTRATION    Digits 1 /4   Months Yes   Total 2 /5           BESS    Nondom foot tested left   Footwear: shoes   Surface: hard        ERRORS   Double Leg Stance  0/10   Single Leg Stance 1/10   Tandem Stance 3/10   Total 4/30       DELAYED RECALL 2 /5           VOMS TESTING Worsening Headache, Dizziness, Nausea, Fogginess   Smooth Pursuits No   Saccades - Horizontal No   Saccades - Vertical No   Convergence  No   Visual Motion Sensitivity Yes            OSTEOPATHIC/STRUCTURAL EXAM:    C2 FRS right  C5 FRS left  Elevated first right rib  SBS cranial compression   Imaging & Procedures: PROCEDURE NOTE : OSTEOPATHIC MANIPULATION The  decision today to treat with Osteopathic Manipulative Therapy (OMT) was based on physical exam findings. Verbal consent was obtained after after explanation of risks, benefits and potential side effects, including acute pain flare, post manipulation soreness and need for repeat treatments.  If Cervical manipulation was performed additional time was spent discussing the associated minimal risk of  injury to neurovascular structures.  After consent was obtained manipulation was performed as below:            Regions treated:  Per billing codes          Techniques used:  Direct, Muscle Energy, OCT, CS  and ART The patient tolerated the treatment well and reported Improved symptoms following treatment today. Patient was given medications, exercises, stretches and lifestyle modifications per AVS and verbally.     Assessment & Plan: Problem List Items Addressed This Visit    Concussion with no loss of consciousness - Primary    Appropriate mechanism for concussion with classic symptoms.  Time spent discussing the nature  of concussion and the warning signs and symptoms.  Recommend avoidance of cognitive as well as physical exertion over the next several days and a work excuse has been provided.  The lack of improvement I am happy to see her back but anticipate being able to return to normal activities with only mild residual symptoms in 5 days.  Once again if persistent symptoms we will plan to follow-up with her next week.  Osteopathic manipulation performed for the associated cervical, rib and cranial findings.       Other Visit Diagnoses    Somatic dysfunction of cervical region       Somatic dysfunction of head region       Somatic dysfunction of rib region          Follow-up: Return if symptoms worsen or fail to improve.   Past Medical/Family/Surgical/Social History: Medications & Allergies reviewed per EMR Patient Active Problem List   Diagnosis Date Noted  . Concussion with no loss of consciousness 08/22/2016  . Family history of colon cancer - father, paternal grandmother, cousin 04/09/2016  . Urinary frequency 03/08/2015  . Hematuria 03/08/2015  . Hot flashes 03/08/2015  . PMB (postmenopausal bleeding) 03/08/2015   Past Medical History:  Diagnosis Date  . Allergy   . Diabetes mellitus without complication (HCC)    pre- diabetic, no meds 04-09-16  . Hematuria 03/08/2015  . Hot flashes 03/08/2015  . PMB (postmenopausal bleeding) 03/08/2015  . Seizures (HCC)    as an infant, child and in high school.  Have not had seizure since high school per pt.  . Urinary frequency 03/08/2015   Family History  Problem Relation Age of Onset  . Colon cancer Father   . Other Father     leaky heart valve  . Cancer Paternal Grandmother     colon, pancreatic  . Colon cancer Paternal Grandmother   . Pancreatic cancer Paternal Grandmother   . Colon cancer Cousin   . Esophageal cancer Neg Hx   . Rectal cancer Neg Hx   . Stomach cancer Neg Hx    Past Surgical History:  Procedure Laterality Date  .  BLADDER SURGERY     bladder was stretched  . TONSILLECTOMY    . WISDOM TOOTH EXTRACTION     Social History   Occupational History  . Not on file.   Social History Main Topics  . Smoking status: Never Smoker  . Smokeless tobacco: Never Used  . Alcohol use  1.2 oz/week    2 Glasses of wine per week     Comment: occ  . Drug use: No  . Sexual activity: Yes    Birth control/ protection: Post-menopausal

## 2016-08-22 NOTE — Assessment & Plan Note (Signed)
Appropriate mechanism for concussion with classic symptoms.  Time spent discussing the nature of concussion and the warning signs and symptoms.  Recommend avoidance of cognitive as well as physical exertion over the next several days and a work excuse has been provided.  The lack of improvement I am happy to see her back but anticipate being able to return to normal activities with only mild residual symptoms in 5 days.  Once again if persistent symptoms we will plan to follow-up with her next week.  Osteopathic manipulation performed for the associated cervical, rib and cranial findings.

## 2016-08-22 NOTE — Patient Instructions (Signed)
Concussion, Adult A concussion is a brain injury from a direct hit (blow) to the head or body. This blow causes the brain to shake quickly back and forth inside the skull. This can damage brain cells and cause chemical changes in the brain. A concussion may also be known as a mild traumatic brain injury (TBI). Concussions are usually not life-threatening, but the effects of a concussion can be serious. If you have a concussion, you are more likely to experience concussion-like symptoms after a direct blow to the head in the future. What are the causes? This condition is caused by:  A direct blow to the head, such as from running into another player during a game, being hit in a fight, or hitting your head on a hard surface.  A jolt of the head or neck that causes the brain to move back and forth inside the skull, such as in a car crash.  What are the signs or symptoms? The signs of a concussion can be hard to notice. Early on, they may be missed by you, family members, and health care providers. You may look fine but act or feel differently. Symptoms are usually temporary, but they may last for days, weeks, or even longer. Some symptoms may appear right away but other symptoms may not show up for hours or days. Every head injury is different. Symptoms may include:  Headaches. This can include a feeling of pressure in the head.  Memory problems.  Trouble concentrating, organizing, or making decisions.  Slowness in thinking, acting or reacting, speaking, or reading.  Confusion.  Fatigue.  Changes in eating or sleeping patterns.  Problems with coordination or balance.  Nausea or vomiting.  Numbness or tingling.  Sensitivity to light or noise.  Vision or hearing problems.  Reduced sense of smell.  Irritability or mood changes.  Dizziness.  Lack of motivation.  Seeing or hearing things that other people do not see or hear (hallucinations).  How is this diagnosed? This  condition is diagnosed based on:  Your symptoms.  A description of your injury.  You may also have tests, including:  Imaging tests, such as a CT scan or MRI. These are done to look for signs of brain injury.  Neuropsychological tests. These measure your thinking, understanding, learning, and remembering abilities.  How is this treated? This condition is treated with physical and mental rest and careful observation, usually at home. If the concussion is severe, you may need to stay home from work for a while. You may be referred to a concussion clinic or to other health care providers for management. It is important that you tell your health care provider if:  You are taking any medicines, including prescription medicines, over-the-counter medicines, and natural remedies. Some medicines, such as blood thinners (anticoagulants) and aspirin, may increase the chance of complications, such as bleeding.  You are taking or have taken alcohol or illegal drugs. Alcohol and certain other drugs may slow your recovery and can put you at risk of further injury.  How fast you will recover from a concussion depends on many factors, such as how severe your concussion is, what part of your brain was injured, how old you are, and how healthy you were before the concussion. Recovery can take time. It is important to wait to return to activity until a health care provider says it is safe to do that and your symptoms are completely gone. Follow these instructions at home: Activity  Limit activities that   require a lot of thought or concentration. These may include: ? Doing homework or job-related work. ? Watching TV. ? Working on the computer. ? Playing memory games and puzzles.  Rest. Rest helps the brain to heal. Make sure you: ? Get plenty of sleep at night. Avoid staying up late at night. ? Keep the same bedtime hours on weekends and weekdays. ? Rest during the day. Take naps or rest breaks when you  feel tired.  Having another concussion before the first one has healed can be dangerous. Do not do high-risk activities that could cause a second concussion, such as riding a bicycle or playing sports.  Ask your health care provider when you can return to your normal activities, such as school, work, athletics, driving, riding a bicycle, or using heavy machinery. Your ability to react may be slower after a brain injury. Never do these activities if you are dizzy. Your health care provider will likely give you a plan for gradually returning to activities. General instructions  Take over-the-counter and prescription medicines only as told by your health care provider.  Do not drink alcohol until your health care provider says you can.  If it is harder than usual to remember things, write them down.  If you are easily distracted, try to do one thing at a time. For example, do not try to watch TV while fixing dinner.  Talk with family members or close friends when making important decisions.  Watch your symptoms and tell others to do the same. Complications sometimes occur after a concussion. Older adults with a brain injury may have a higher risk of serious complications, such as a blood clot in the brain.  Tell your teachers, school nurse, school counselor, coach, athletic trainer, or work manager about your injury, symptoms, and restrictions. Tell them about what you can or cannot do. They should watch for: ? Increased problems with attention or concentration. ? Increased difficulty remembering or learning new information. ? Increased time needed to complete tasks or assignments. ? Increased irritability or decreased ability to cope with stress. ? Increased symptoms.  Keep all follow-up visits as told by your health care provider. This is important. How is this prevented? It is very important to avoid another brain injury, especially as you recover. In rare cases, another injury can lead  to permanent brain damage, brain swelling, or death. The risk of this is greatest during the first 7-10 days after a head injury. Avoid injuries by:  Wearing a seat belt when riding in a car.  Wearing a helmet when biking, skiing, skateboarding, skating, or doing similar activities.  Avoiding activities that could lead to a second concussion, such as contact or recreational sports, until your health care provider says it is okay.  Taking safety measures in your home, such as: ? Removing clutter and tripping hazards from floors and stairways. ? Using grab bars in bathrooms and handrails by stairs. ? Placing non-slip mats on floors and in bathtubs. ? Improving lighting in dim areas.  Contact a health care provider if:  Your symptoms get worse.  You have new symptoms.  You continue to have symptoms for more than 2 weeks. Get help right away if:  You have severe or worsening headaches.  You have weakness or numbness in any part of your body.  Your coordination gets worse.  You vomit repeatedly.  You are sleepier.  The pupil of one eye is larger than the other.  You have convulsions or a   seizure.  Your speech is slurred.  Your fatigue, confusion, or irritability gets worse.  You cannot recognize people or places.  You have neck pain.  It is difficult to wake you up.  You have unusual behavior changes.  You lose consciousness. Summary  A concussion is a brain injury from a direct hit (blow) to the head or body.  A concussion may also be called a mild traumatic brain injury (TBI).  You may have imaging tests and neuropsychological tests to diagnose a concussion.  This condition is treated with physical and mental rest and careful observation.  Ask your health care provider when you can return to your normal activities, such as school, work, athletics, driving, riding a bicycle, or using heavy machinery. Follow safety instructions as told by your health care  provider. This information is not intended to replace advice given to you by your health care provider. Make sure you discuss any questions you have with your health care provider. Document Released: 08/11/2003 Document Revised: 05/01/2016 Document Reviewed: 05/01/2016 Elsevier Interactive Patient Education  2017 [employer]  

## 2016-08-29 ENCOUNTER — Emergency Department (HOSPITAL_COMMUNITY)
Admission: EM | Admit: 2016-08-29 | Discharge: 2016-08-29 | Disposition: A | Discharge status: Home / Self Care | Payer: Worker's Compensation | Attending: Emergency Medicine | Admitting: Emergency Medicine

## 2016-08-29 ENCOUNTER — Encounter (HOSPITAL_COMMUNITY): Payer: Self-pay | Admitting: *Deleted

## 2016-08-29 ENCOUNTER — Telehealth: Payer: Self-pay | Admitting: Sports Medicine

## 2016-08-29 DIAGNOSIS — Y9389 Activity, other specified: Secondary | ICD-10-CM | POA: Insufficient documentation

## 2016-08-29 DIAGNOSIS — W228XXA Striking against or struck by other objects, initial encounter: Secondary | ICD-10-CM | POA: Diagnosis not present

## 2016-08-29 DIAGNOSIS — Y999 Unspecified external cause status: Secondary | ICD-10-CM | POA: Diagnosis not present

## 2016-08-29 DIAGNOSIS — Y92219 Unspecified school as the place of occurrence of the external cause: Secondary | ICD-10-CM | POA: Insufficient documentation

## 2016-08-29 DIAGNOSIS — S060X0A Concussion without loss of consciousness, initial encounter: Secondary | ICD-10-CM

## 2016-08-29 DIAGNOSIS — E119 Type 2 diabetes mellitus without complications: Secondary | ICD-10-CM | POA: Insufficient documentation

## 2016-08-29 DIAGNOSIS — H8301 Labyrinthitis, right ear: Secondary | ICD-10-CM

## 2016-08-29 DIAGNOSIS — H81391 Other peripheral vertigo, right ear: Secondary | ICD-10-CM | POA: Diagnosis not present

## 2016-08-29 DIAGNOSIS — S0990XA Unspecified injury of head, initial encounter: Secondary | ICD-10-CM | POA: Diagnosis present

## 2016-08-29 MED ORDER — DEXAMETHASONE 4 MG PO TABS
10.0000 mg | ORAL_TABLET | Freq: Once | ORAL | Status: AC
  Administered 2016-08-29: 10 mg via ORAL
  Filled 2016-08-29: qty 3

## 2016-08-29 MED ORDER — MECLIZINE HCL 25 MG PO TABS: 25.0000 mg | ORAL_TABLET | Freq: Three times a day (TID) | ORAL | 0 refills | Status: DC | PRN

## 2016-08-29 NOTE — Telephone Encounter (Signed)
Noted--will send to Dr. Berline Choughigby as a FYI.

## 2016-08-29 NOTE — ED Triage Notes (Signed)
Pt reports hitting her head at work last Tuesday, initially had headache and n/v. Has been to pcp and diagnosed with concussion. Still has mild headache but also "not feeling right." has some sensitivity to light. No acute distress noted at triage, a&ox4.

## 2016-08-29 NOTE — ED Notes (Signed)
Pt states head feels like a balloon and states she has no energy.  Denies headaches at this time but feels foggy headed.  When patient turns head or looks up, lacks ability to focus.

## 2016-08-29 NOTE — Telephone Encounter (Signed)
Patients mother called trying to get records faxed over for appointment with workers comp doctor today. I was unsure whether we could send it to her home fax, so I advised her she should be able to pull everything up in her mychart or can come pick up a copy of the last visit here- I just want to make sure I tell her correctly.

## 2016-08-29 NOTE — Discharge Instructions (Signed)
Follow up with a concussion specialist and your PCP.

## 2016-08-29 NOTE — ED Provider Notes (Signed)
[facility]-EMERGENCY DEPT Provider Note   CSN: [id number] Arrival date & time: 08/29/16  1152   By signing my name below, I, Teofilo PodMatthew P. Jamison, attest that this documentation has been prepared under the direction and in the presence of Melene Planan Haizley Cannella, DO . Electronically Signed: Teofilo PodMatthew P. Jamison, ED Scribe. 08/29/2016. 3:11 PM.   History   Chief Complaint Chief Complaint  Patient presents with  . Head Injury   The history is provided by the patient. No language interpreter was used.   HPI Comments:  Patricia Huerta is a 56 y.o. female who presents to the Emergency Department here due to a head injury that occurred 8 days ago. Pt is a Midwifekindergarten teacher and reports that she was standing over a student's desk at school and the student jumped up and hit his head in to her chin. She complains of associated headache, dizziness, cough, general weakness, and photophobia. She states that she becomes generally weak after walking for long periods. She had vomiting and diarrhea that has since resolved. She went to her PCP and was diagnosed with a concussion and sinus infection, and she was referred to the ED for workers comp purposes. She was prescribed keflex for the infection. Pt does not take anticoagulates. No alleviating factors noted. Pt denies diarrhea, vomiting, ear pain.     Past Medical History:  Diagnosis Date  . Allergy   . Diabetes mellitus without complication (HCC)    pre- diabetic, no meds 04-09-16  . Hematuria 03/08/2015  . Hot flashes 03/08/2015  . PMB (postmenopausal bleeding) 03/08/2015  . Seizures (HCC)    as an infant, child and in high school.  Have not had seizure since high school per pt.  . Urinary frequency 03/08/2015    Patient Active Problem List   Diagnosis Date Noted  . Concussion with no loss of consciousness 08/22/2016  . Family history of colon cancer - father, paternal grandmother, cousin 04/09/2016  . Urinary frequency 03/08/2015  . Hematuria 03/08/2015  . Hot  flashes 03/08/2015  . PMB (postmenopausal bleeding) 03/08/2015    Past Surgical History:  Procedure Laterality Date  . BLADDER SURGERY     bladder was stretched  . TONSILLECTOMY    . WISDOM TOOTH EXTRACTION      OB History    Gravida Para Term Preterm AB Living   0 0 0 0 0 0   SAB TAB Ectopic Multiple Live Births   0 0 0 0         Home Medications    Prior to Admission medications   Medication Sig Start Date End Date Taking? Authorizing Provider  flintstones complete (FLINTSTONES) 60 MG chewable tablet Chew 1 tablet by mouth daily.    Historical Provider, MD  meclizine (ANTIVERT) 25 MG tablet Take 1 tablet (25 mg total) by mouth 3 (three) times daily as needed for dizziness. 08/29/16   Melene Planan Lovene Maret, DO    Family History Family History  Problem Relation Age of Onset  . Colon cancer Father   . Other Father     leaky heart valve  . Cancer Paternal Grandmother     colon, pancreatic  . Colon cancer Paternal Grandmother   . Pancreatic cancer Paternal Grandmother   . Colon cancer Cousin   . Esophageal cancer Neg Hx   . Rectal cancer Neg Hx   . Stomach cancer Neg Hx     Social History Social History  Substance Use Topics  . Smoking status: Never Smoker  .  Smokeless tobacco: Never Used  . Alcohol use 1.2 oz/week    2 Glasses of wine per week     Comment: occ     Allergies   Novocain [procaine]   Review of Systems Review of Systems  HENT: Negative for ear pain.   Eyes: Positive for photophobia.  Gastrointestinal: Positive for nausea. Negative for vomiting.  Neurological: Positive for dizziness and headaches. Negative for syncope and weakness.  All other systems reviewed and are negative.    Physical Exam Updated Vital Signs BP 96/67 (BP Location: Left Arm)   Pulse 65   Temp 98.4 F (36.9 C) (Oral)   Resp 18   SpO2 99%   Physical Exam  Constitutional: She is oriented to person, place, and time. She appears well-developed and well-nourished. No  distress.  HENT:  Head: Normocephalic and atraumatic.  Effusion to right TM.   Eyes: Pupils are equal, round, and reactive to light. Right eye exhibits nystagmus.  Neck: Normal range of motion. Neck supple.  Cardiovascular: Normal rate and regular rhythm.  Exam reveals no gallop and no friction rub.   No murmur heard. Pulmonary/Chest: Effort normal. She has no wheezes. She has no rales.  Abdominal: Soft. She exhibits no distension. There is no tenderness.  Musculoskeletal: She exhibits no edema or tenderness.  Neurological: She is alert and oriented to person, place, and time. She has normal strength. No cranial nerve deficit. She displays a negative Romberg sign. Coordination and gait normal.  Reflex Scores:      Tricep reflexes are 2+ on the right side and 2+ on the left side.      Bicep reflexes are 2+ on the right side and 2+ on the left side.      Brachioradialis reflexes are 2+ on the right side and 2+ on the left side.      Patellar reflexes are 2+ on the right side and 2+ on the left side.      Achilles reflexes are 2+ on the right side and 2+ on the left side. Skin: Skin is warm and dry. She is not diaphoretic.  Psychiatric: She has a normal mood and affect. Her behavior is normal.  Nursing note and vitals reviewed.    ED Treatments / Results  DIAGNOSTIC STUDIES:  Oxygen Saturation is 99% on RA, normal by my interpretation.    COORDINATION OF CARE:  2:57 PM Discussed treatment plan with pt at bedside and pt agreed to plan.   Labs (all labs ordered are listed, but only abnormal results are displayed) Labs Reviewed - No data to display  EKG  EKG Interpretation None       Radiology No results found.  Procedures Procedures (including critical care time)  Medications Ordered in ED Medications  dexamethasone (DECADRON) tablet 10 mg (10 mg Oral Given 08/29/16 1523)     Initial Impression / Assessment and Plan / ED Course  I have reviewed the triage vital  signs and the nursing notes.  Pertinent labs & imaging results that were available during my care of the patient were reviewed by me and considered in my medical decision making (see chart for details).     56 yo F With a chief complaint of headaches and feeling like her head is in a cloud. Patient has been clinically diagnosed with a concussion by a concussion specialist. He did neurocognitive testing to diagnose this. However her history seems compensated because she had a viral illness that sounds a lot like influenza immediately  after that testing. Currently she is complaining of some increased dizziness. She has right-sided fast going nystagmus. She also has a right-sided effusion. With a recent sinus infection I think this is most likely labyrinthitis. Will treat with steroids and meclizine. Patient needs paperwork filled out for workers comp who are unable to take a concussion specialist diagnosis as he is not on the list of physicians.  Suggested she follow up with the concussion specialist.    4:12 PM:  I have discussed the diagnosis/risks/treatment options with the patient and family and believe the pt to be eligible for discharge home to follow-up with PCP. We also discussed returning to the ED immediately if new or worsening sx occur. We discussed the sx which are most concerning (e.g., sudden worsening pain, fever, inability to tolerate by mouth) that necessitate immediate return. Medications administered to the patient during their visit and any new prescriptions provided to the patient are listed below.  Medications given during this visit Medications  dexamethasone (DECADRON) tablet 10 mg (10 mg Oral Given 08/29/16 1523)     The patient appears reasonably screen and/or stabilized for discharge and I doubt any other medical condition or other [facility] requiring further screening, evaluation, or treatment in the ED at this time prior to discharge.    Final Clinical Impressions(s) / ED  Diagnoses   Final diagnoses:  Concussion without loss of consciousness, initial encounter  Labyrinthitis of right ear  Peripheral vertigo involving right ear    New Prescriptions New Prescriptions   MECLIZINE (ANTIVERT) 25 MG TABLET    Take 1 tablet (25 mg total) by mouth 3 (three) times daily as needed for dizziness.  I personally performed the services described in this documentation, which was scribed in my presence. The recorded information has been reviewed and is accurate.      Melene Plan, DO 08/29/16 1612

## 2016-09-03 ENCOUNTER — Ambulatory Visit (INDEPENDENT_AMBULATORY_CARE_PROVIDER_SITE_OTHER): Payer: BC Managed Care – PPO | Admitting: Sports Medicine

## 2016-09-03 ENCOUNTER — Encounter: Payer: Self-pay | Admitting: Sports Medicine

## 2016-09-03 VITALS — BP 94/60 | HR 65 | Ht 64.0 in | Wt 124.4 lb

## 2016-09-03 DIAGNOSIS — J111 Influenza due to unidentified influenza virus with other respiratory manifestations: Secondary | ICD-10-CM

## 2016-09-03 DIAGNOSIS — S060X0A Concussion without loss of consciousness, initial encounter: Secondary | ICD-10-CM

## 2016-09-03 NOTE — Assessment & Plan Note (Signed)
This is complicated presentation but symptoms are still consistent with a concussion.  I do believe that she had a true concussion sustained on 08/21/2016.  Her recovery has been complicated by what appears to be an influenza infection.  High fevers malaise, fatigue, body aches consistent with the flu.  Unfortunately some of the symptoms do overlap with the concussion symptoms that she is experiencing and differentiating between the cause of each is impossible.  She has showed marked improvement over the past week in spite of the illness and is continuing to improve.  She is out of work this week for spring break and I suspect that she should be able to return in 1 week as I anticipate her symptoms continuing to improve dramatically.  Work excuse note provided today and we will plan to reevaluate her at the beginning of next week with anticipated return.  The dizziness and lightheadedness she was having quite possibly is associated with a labyrinthitis given the nystagmus has improved today.  Meclizine is appropriate to continue if needed.  Emotional lability is current prominent feature and this will need to be followed closely.  She has no prior history of depression but has had a significant increase in her stress levels at work due to a difficult year.  >50% of this 40 minute visit spent in direct patient counseling and/or coordination of care.  Discussion was focused on the psychosocial stressors associated with sustaining a concussion and anticipated return to work.

## 2016-09-03 NOTE — Progress Notes (Signed)
OFFICE VISIT NOTE Patricia Huerta. Patricia Huerta [facility] [phone]  Patricia Huerta - 56 y.o. female MRN [id number]  Date of birth: [birth year]  Visit Date: 09/03/2016  PCP: Lazaro Arms, MD   Referred by: Lazaro Arms, MD  SUBJECTIVE:   Chief Complaint  Patient presents with  . Follow-up    f/u on concussion. Pt still feeling groggy and weak. She is consantly shaky.    HPI: Patient has had a complicated recovery as it sounds as though experience symptoms of influenza.  She was told that she had a urinary tract infection and given the enterococcus findings on urine culture she is being treated with antibiotics.  She has had many of the overlying symptoms consistent with influenza including high fevers, body aches and chills.  This is compounding the classic concussion symptoms that she had.  The concussion symptoms were in an isolated event with acute onset immediately following impact as outlined below with a subsequent acute onset of fevers chills and body aches several days later consistent with an influenza infection. ROS:   Otherwise per HPI.  HISTORY & PERTINENT PRIOR DATA:  No specialty comments available. She reports that she has never smoked. She has never used smokeless tobacco.   Recent Labs  03/20/16 0826  HGBA1C 5.8*   Medications & Allergies reviewed per EMR Patient Active Problem List   Diagnosis Date Noted  . Influenza 09/03/2016  . Concussion with no loss of consciousness 08/22/2016  . Family history of colon cancer - father, paternal grandmother, cousin 04/09/2016  . Urinary frequency 03/08/2015  . Hematuria 03/08/2015  . Hot flashes 03/08/2015  . PMB (postmenopausal bleeding) 03/08/2015   Past Medical History:  Diagnosis Date  . Allergy   . Diabetes mellitus without complication (HCC)    pre- diabetic, no meds 04-09-16  . Hematuria 03/08/2015  . Hot flashes 03/08/2015  . PMB (postmenopausal bleeding)  03/08/2015  . Seizures (HCC)    as an infant, child and in high school.  Have not had seizure since high school per pt.  . Urinary frequency 03/08/2015   Family History  Problem Relation Age of Onset  . Colon cancer Father   . Other Father     leaky heart valve  . Cancer Paternal Grandmother     colon, pancreatic  . Colon cancer Paternal Grandmother   . Pancreatic cancer Paternal Grandmother   . Colon cancer Cousin   . Esophageal cancer Neg Hx   . Rectal cancer Neg Hx   . Stomach cancer Neg Hx    Past Surgical History:  Procedure Laterality Date  . BLADDER SURGERY     bladder was stretched  . TONSILLECTOMY    . WISDOM TOOTH EXTRACTION     Social History   Occupational History  . Not on file.   Social History Main Topics  . Smoking status: Never Smoker  . Smokeless tobacco: Never Used  . Alcohol use 1.2 oz/week    2 Glasses of wine per week     Comment: occ  . Drug use: No  . Sexual activity: Yes    Birth control/ protection: Post-menopausal    OBJECTIVE:  VS:  HT:5\' 4"  (162.6 cm)   WT:124 lb 6.4 oz (56.4 kg)  BMI:21.4    BP:94/60  HR:65bpm  TEMP: ( )  RESP:98 % PHYSICAL EXAM: General:   WDWN, NAD, Non-toxic appearing Psych:  Alert & appropriately interactive  Not depressed or  anxious appearing Neuro:  Symptoms score of 13/22 and 31/132 Improved nystagmus VOMS normal, not symptom iliticing   IMAGING & PROCEDURES: No results found.  ASSESSMENT & PLAN:  Visit Diagnoses:  1. Concussion without loss of consciousness, initial encounter   2. Influenza    Meds: No orders of the defined types were placed in this encounter.   Orders: No orders of the defined types were placed in this encounter.   Follow-up: Return in about 1 week (around 09/10/2016).   Otherwise please see problem oriented charting as below.

## 2016-09-04 ENCOUNTER — Ambulatory Visit: Payer: BC Managed Care – PPO | Admitting: Family Medicine

## 2016-09-10 ENCOUNTER — Ambulatory Visit: Payer: BC Managed Care – PPO | Admitting: Sports Medicine

## 2016-09-10 ENCOUNTER — Telehealth: Payer: Self-pay | Admitting: Sports Medicine

## 2016-09-10 ENCOUNTER — Encounter: Payer: Self-pay | Admitting: Sports Medicine

## 2016-09-10 NOTE — Telephone Encounter (Signed)
Called pt and advised. Letter sent to pt via MyChart.

## 2016-09-10 NOTE — Telephone Encounter (Signed)
Patient called wanting to know if Dr. Berline Chough could write up the paperwork stating she is okay as they discussed previosuly. She understands he cannot legally release due to him not being workers comp.

## 2016-09-10 NOTE — Progress Notes (Signed)
Letter drafted for her to be able to return to work without limitations.

## 2016-09-10 NOTE — Telephone Encounter (Signed)
Forwarding to Dr. Rigby.  

## 2016-09-10 NOTE — Telephone Encounter (Signed)
Letter has been drafted.  Okay to print or email as needed.

## 2017-01-15 ENCOUNTER — Encounter: Payer: Self-pay | Admitting: Adult Health

## 2017-01-15 ENCOUNTER — Ambulatory Visit (INDEPENDENT_AMBULATORY_CARE_PROVIDER_SITE_OTHER): Payer: BC Managed Care – PPO | Admitting: Adult Health

## 2017-01-15 VITALS — BP 118/72 | HR 58 | Ht 64.5 in | Wt 130.5 lb

## 2017-01-15 DIAGNOSIS — L659 Nonscarring hair loss, unspecified: Secondary | ICD-10-CM

## 2017-01-15 DIAGNOSIS — R635 Abnormal weight gain: Secondary | ICD-10-CM

## 2017-01-15 DIAGNOSIS — R5383 Other fatigue: Secondary | ICD-10-CM | POA: Diagnosis not present

## 2017-01-15 NOTE — Progress Notes (Signed)
Subjective:     Patient ID: Patricia Huerta, female   DOB: Jun 25, 1960, 56 y.o.   MRN: [id number]  HPI Patricia Huerta is a 56 year old white female, married,PM in complaining of hair loss for about 2 months, feeling tired and weight gain, no constipation issues. Scalp itches some.  Review of Systems Hair loss for about 2 months Scalp itches some  Feeling tired Weight gain No constipation issues Reviewed past medical,surgical, social and family history. Reviewed medications and allergies.     Objective:   Physical Exam BP 118/72 (BP Location: Left Arm, Patient Position: Sitting, Cuff Size: Normal)   Pulse (!) 58   Ht 5' 4.5" (1.638 m)   Wt 130 lb 8 oz (59.2 kg)   BMI 22.05 kg/m  Skin warm and dry. Neck: mid line trachea, normal thyroid, good ROM, no lymphadenopathy noted. Lungs: clear to ausculation bilaterally. Cardiovascular: regular rate and rhythm. When finger run through hair >6 strands comes out easily, no bald spots but hair not as thick as used to be, can get easily in clip.    Will check labs first, if normal, may refer to dermatologist.   Assessment:     1. Hair loss   2. Feeling tired   3. Weight gain       Plan:     Check CBC,CMP,TSH and free T4 and thyroid antibody Will talk when labs back  Follow up prn

## 2017-01-16 LAB — COMPREHENSIVE METABOLIC PANEL
A/G RATIO: 1.8 (ref 1.2–2.2)
ALT: 21 IU/L (ref 0–32)
AST: 31 IU/L (ref 0–40)
Albumin: 4.6 g/dL (ref 3.5–5.5)
Alkaline Phosphatase: 80 IU/L (ref 39–117)
BUN/Creatinine Ratio: 21 (ref 9–23)
BUN: 18 mg/dL (ref 6–24)
Bilirubin Total: <0.2 mg/dL (ref 0.0–1.2)
CALCIUM: 9.9 mg/dL (ref 8.7–10.2)
CO2: 23 mmol/L (ref 20–29)
CREATININE: 0.85 mg/dL (ref 0.57–1.00)
Chloride: 101 mmol/L (ref 96–106)
GFR calc Af Amer: 90 mL/min/{1.73_m2} (ref >59)
GFR, EST NON AFRICAN AMERICAN: 78 mL/min/{1.73_m2} (ref >59)
GLUCOSE: 88 mg/dL (ref 65–99)
Globulin, Total: 2.6 g/dL (ref 1.5–4.5)
POTASSIUM: 4.5 mmol/L (ref 3.5–5.2)
Sodium: 141 mmol/L (ref 134–144)
Total Protein: 7.2 g/dL (ref 6.0–8.5)

## 2017-01-16 LAB — CBC
Hematocrit: 39.7 % (ref 34.0–46.6)
Hemoglobin: 13.3 g/dL (ref 11.1–15.9)
MCH: 29.2 pg (ref 26.6–33.0)
MCHC: 33.5 g/dL (ref 31.5–35.7)
MCV: 87 fL (ref 79–97)
PLATELETS: 320 10*3/uL (ref 150–379)
RBC: 4.55 x10E6/uL (ref 3.77–5.28)
RDW: 13.5 % (ref 12.3–15.4)
WBC: 6.9 10*3/uL (ref 3.4–10.8)

## 2017-01-16 LAB — T4, FREE: FREE T4: 1.12 ng/dL (ref 0.82–1.77)

## 2017-01-16 LAB — THYROID PEROXIDASE ANTIBODY: Thyroperoxidase Ab SerPl-aCnc: 13 IU/mL (ref 0–34)

## 2017-01-16 LAB — TSH: TSH: 3.17 u[IU]/mL (ref 0.450–4.500)

## 2017-01-17 ENCOUNTER — Telehealth: Payer: Self-pay | Admitting: Adult Health

## 2017-01-17 NOTE — Telephone Encounter (Signed)
Left message that labs normal, I would suggest seeing dermatologist

## 2019-01-28 ENCOUNTER — Other Ambulatory Visit: Payer: Self-pay | Admitting: *Deleted

## 2019-01-28 DIAGNOSIS — J09X2 Influenza due to identified novel influenza A virus with other respiratory manifestations: Secondary | ICD-10-CM

## 2019-01-29 ENCOUNTER — Telehealth: Payer: Self-pay | Admitting: Obstetrics & Gynecology

## 2019-01-29 LAB — NOVEL CORONAVIRUS, NAA: SARS-CoV-2, NAA: NOT DETECTED

## 2019-01-29 NOTE — Telephone Encounter (Signed)
Informed patient of COVID results, patient voice understanding °

## 2019-02-05 ENCOUNTER — Other Ambulatory Visit: Payer: BC Managed Care – PPO | Admitting: Adult Health

## 2019-05-07 ENCOUNTER — Other Ambulatory Visit: Payer: Self-pay

## 2019-05-07 DIAGNOSIS — Z20822 Contact with and (suspected) exposure to covid-19: Secondary | ICD-10-CM

## 2019-05-10 LAB — NOVEL CORONAVIRUS, NAA: SARS-CoV-2, NAA: NOT DETECTED

## 2019-05-21 ENCOUNTER — Other Ambulatory Visit (HOSPITAL_COMMUNITY)
Admission: RE | Admit: 2019-05-21 | Discharge: 2019-05-21 | Disposition: A | Discharge status: Home / Self Care | Payer: BC Managed Care – PPO | Source: Ambulatory Visit | Attending: Obstetrics & Gynecology | Admitting: Obstetrics & Gynecology

## 2019-05-21 ENCOUNTER — Encounter: Payer: Self-pay | Admitting: Obstetrics & Gynecology

## 2019-05-21 ENCOUNTER — Other Ambulatory Visit: Payer: Self-pay

## 2019-05-21 ENCOUNTER — Ambulatory Visit (INDEPENDENT_AMBULATORY_CARE_PROVIDER_SITE_OTHER): Payer: BC Managed Care – PPO | Admitting: Obstetrics & Gynecology

## 2019-05-21 VITALS — BP 88/57 | HR 68 | Ht 65.0 in | Wt 117.0 lb

## 2019-05-21 DIAGNOSIS — R634 Abnormal weight loss: Secondary | ICD-10-CM

## 2019-05-21 DIAGNOSIS — Z0000 Encounter for general adult medical examination without abnormal findings: Secondary | ICD-10-CM

## 2019-05-21 DIAGNOSIS — Z1231 Encounter for screening mammogram for malignant neoplasm of breast: Secondary | ICD-10-CM | POA: Diagnosis not present

## 2019-05-21 DIAGNOSIS — F329 Major depressive disorder, single episode, unspecified: Secondary | ICD-10-CM

## 2019-05-21 DIAGNOSIS — F32A Depression, unspecified: Secondary | ICD-10-CM

## 2019-05-21 DIAGNOSIS — Z01411 Encounter for gynecological examination (general) (routine) with abnormal findings: Secondary | ICD-10-CM

## 2019-05-21 DIAGNOSIS — R5383 Other fatigue: Secondary | ICD-10-CM

## 2019-05-21 MED ORDER — ESCITALOPRAM OXALATE 10 MG PO TABS: 10.0000 mg | ORAL_TABLET | Freq: Every day | ORAL | 11 refills | Status: DC

## 2019-05-21 MED ORDER — ZOLPIDEM TARTRATE 5 MG PO TABS: 5.0000 mg | ORAL_TABLET | Freq: Every evening | ORAL | 0 refills | Status: DC | PRN

## 2019-05-21 NOTE — Patient Instructions (Signed)
Breast center      [phone]

## 2019-05-21 NOTE — Progress Notes (Signed)
Subjective:     Patricia Huerta is a 58 y.o. female here for a routine exam.  No LMP recorded. Patient is postmenopausal. [id number] Birth Control Method:  menopause Menstrual Calendar(currently): amenorrhea  Current complaints: anxiety issues with depression lethergy anhedonia insomnia initial.   Current acute medical issues:  As above   Recent Gynecologic History No LMP recorded. Patient is postmenopausal. Last Pap: 8/17,  normal Last mammogram: 2020,  normal  Past Medical History:  Diagnosis Date  . Allergy   . Diabetes mellitus without complication ([place])    pre- diabetic, no meds 04-09-16  . Hematuria 03/08/2015  . Hot flashes 03/08/2015  . PMB (postmenopausal bleeding) 03/08/2015  . Seizures ([place])    as an infant, child and in high school.  Have not had seizure since high school per pt.  . Urinary frequency 03/08/2015    Past Surgical History:  Procedure Laterality Date  . BLADDER SURGERY     bladder was stretched  . TONSILLECTOMY    . WISDOM TOOTH EXTRACTION      OB History    Gravida  0   Para  0   Term  0   Preterm  0   AB  0   Living  0     SAB  0   TAB  0   Ectopic  0   Multiple  0   Live Births              Social History   Socioeconomic History  . Marital status: Single    Spouse name: Not on file  . Number of children: Not on file  . Years of education: Not on file  . Highest education level: Not on file  Occupational History  . Not on file  Tobacco Use  . Smoking status: Never Smoker  . Smokeless tobacco: Never Used  Substance and Sexual Activity  . Alcohol use: Yes    Alcohol/week: 2.0 standard drinks    Types: 2 Glasses of wine per week    Comment: occ  . Drug use: No  . Sexual activity: Yes    Birth control/protection: Post-menopausal  Other Topics Concern  . Not on file  Social History Narrative  . Not on file   Social Determinants of Health   Financial Resource Strain:   . Difficulty of Paying Living Expenses:    Food Insecurity:   . Worried About Charity fundraiser in the Last Year:   . Arboriculturist in the Last Year:   Transportation Needs:   . Film/video editor (Medical):   Marland Kitchen Lack of Transportation (Non-Medical):   Physical Activity:   . Days of Exercise per Week:   . Minutes of Exercise per Session:   Stress:   . Feeling of Stress :   Social Connections:   . Frequency of Communication with Friends and Family:   . Frequency of Social Gatherings with Friends and Family:   . Attends Religious Services:   . Active Member of Clubs or Organizations:   . Attends Archivist Meetings:   Marland Kitchen Marital Status:     Family History  Problem Relation Age of Onset  . Colon cancer Father   . Other Father        leaky heart valve  . Cancer Paternal Grandmother        colon, pancreatic  . Colon cancer Paternal Grandmother   . Pancreatic cancer Paternal Grandmother   . Colon cancer Cousin   .  Esophageal cancer Neg Hx   . Rectal cancer Neg Hx   . Stomach cancer Neg Hx      Current Outpatient Medications:  .  flintstones complete (FLINTSTONES) 60 MG chewable tablet, Chew 1 tablet by mouth daily., Disp: , Rfl:  .  escitalopram (LEXAPRO) 10 MG tablet, Take 1 tablet (10 mg total) by mouth daily., Disp: 30 tablet, Rfl: 11 .  zolpidem (AMBIEN) 5 MG tablet, Take 1 tablet (5 mg total) by mouth at bedtime as needed for sleep. (Patient not taking: Reported on 07/06/2019), Disp: 30 tablet, Rfl: 0  Review of Systems  Review of Systems  Constitutional: Negative for fever, chills, weight loss, malaise/fatigue and diaphoresis.  HENT: Negative for hearing loss, ear pain, nosebleeds, congestion, sore throat, neck pain, tinnitus and ear discharge.   Eyes: Negative for blurred vision, double vision, photophobia, pain, discharge and redness.  Respiratory: Negative for cough, hemoptysis, sputum production, shortness of breath, wheezing and stridor.   Cardiovascular: Negative for chest pain,  palpitations, orthopnea, claudication, leg swelling and PND.  Gastrointestinal: negative for abdominal pain. Negative for heartburn, nausea, vomiting, diarrhea, constipation, blood in stool and melena.  Genitourinary: Negative for dysuria, urgency, frequency, hematuria and flank pain.  Musculoskeletal: Negative for myalgias, back pain, joint pain and falls.  Skin: Negative for itching and rash.  Neurological: Negative for dizziness, tingling, tremors, sensory change, speech change, focal weakness, seizures, loss of consciousness, weakness and headaches.  Endo/Heme/Allergies: Negative for environmental allergies and polydipsia. Does not bruise/bleed easily.  Psychiatric/Behavioral: Negative for depression, suicidal ideas, hallucinations, memory loss and substance abuse. The patient is not nervous/anxious and does not have insomnia.        Objective:  Blood pressure (!) 88/57, pulse 68, height '5\' 5"'  (1.651 m), weight 117 lb (53.1 kg).   Physical Exam  Vitals reviewed. Constitutional: She is oriented to person, place, and time. She appears well-developed and well-nourished.  HENT:  Head: Normocephalic and atraumatic.        Right Ear: External ear normal.  Left Ear: External ear normal.  Nose: Nose normal.  Mouth/Throat: Oropharynx is clear and moist.  Eyes: Conjunctivae and EOM are normal. Pupils are equal, round, and reactive to light. Right eye exhibits no discharge. Left eye exhibits no discharge. No scleral icterus.  Neck: Normal range of motion. Neck supple. No tracheal deviation present. No thyromegaly present.  Cardiovascular: Normal rate, regular rhythm, normal heart sounds and intact distal pulses.  Exam reveals no gallop and no friction rub.   No murmur heard. Respiratory: Effort normal and breath sounds normal. No respiratory distress. She has no wheezes. She has no rales. She exhibits no tenderness.  GI: Soft. Bowel sounds are normal. She exhibits no distension and no mass. There  is no tenderness. There is no rebound and no guarding.  Genitourinary:  Breasts no masses skin changes or nipple changes bilaterally      Vulva is normal without lesions Vagina is pink moist without discharge Cervix normal in appearance and pap is done Uterus is normal size shape and contour Adnexa is negative with normal sized ovaries   Musculoskeletal: Normal range of motion. She exhibits no edema and no tenderness.  Neurological: She is alert and oriented to person, place, and time. She has normal reflexes. She displays normal reflexes. No cranial nerve deficit. She exhibits normal muscle tone. Coordination normal.  Skin: Skin is warm and dry. No rash noted. No erythema. No pallor.  Psychiatric: She has a normal mood and affect. Her  behavior is normal. Judgment and thought content normal.       Medications Ordered at today's visit: Meds ordered this encounter  Medications  . escitalopram (LEXAPRO) 10 MG tablet    Sig: Take 1 tablet (10 mg total) by mouth daily.    Dispense:  30 tablet    Refill:  11  . zolpidem (AMBIEN) 5 MG tablet    Sig: Take 1 tablet (5 mg total) by mouth at bedtime as needed for sleep.    Dispense:  30 tablet    Refill:  0    Other orders placed at today's visit: Orders Placed This Encounter  Procedures  . MM DIGITAL SCREENING BILATERAL  . CBC  . Comp Met (CMET)  . TSH  . Lipid Profile  . Vitamin D 1,25 dihydroxy  . HgB A1c  . Hemoglobin A1c  . Specimen status report      Assessment:    Healthy female exam.   Depression/anxiety/insomnia Plan:    Mammogram ordered.   Begin lexapro Trial of ambien  Return in about 1 month (around 06/21/2019) for Follow up, with Dr Elonda Husky.

## 2019-05-22 LAB — CYTOLOGY - PAP
Comment: NEGATIVE
Diagnosis: NEGATIVE
High risk HPV: NEGATIVE

## 2019-05-23 LAB — HEMOGLOBIN A1C

## 2019-05-25 LAB — SPECIMEN STATUS REPORT

## 2019-05-25 LAB — HEMOGLOBIN A1C
Est. average glucose Bld gHb Est-mCnc: 117 mg/dL
Hgb A1c MFr Bld: 5.7 % — ABNORMAL HIGH (ref 4.8–5.6)

## 2019-05-27 LAB — CBC
Hematocrit: 40 % (ref 34.0–46.6)
Hemoglobin: 13.2 g/dL (ref 11.1–15.9)
MCH: 29.9 pg (ref 26.6–33.0)
MCHC: 33 g/dL (ref 31.5–35.7)
MCV: 91 fL (ref 79–97)
Platelets: 362 10*3/uL (ref 150–450)
RBC: 4.42 x10E6/uL (ref 3.77–5.28)
RDW: 12.4 % (ref 11.7–15.4)
WBC: 6.6 10*3/uL (ref 3.4–10.8)

## 2019-05-27 LAB — LIPID PANEL
Chol/HDL Ratio: 2.2 ratio (ref 0.0–4.4)
Cholesterol, Total: 207 mg/dL — ABNORMAL HIGH (ref 100–199)
HDL: 93 mg/dL (ref >39)
LDL Chol Calc (NIH): 104 mg/dL — ABNORMAL HIGH (ref 0–99)
Triglycerides: 57 mg/dL (ref 0–149)
VLDL Cholesterol Cal: 10 mg/dL (ref 5–40)

## 2019-05-27 LAB — COMPREHENSIVE METABOLIC PANEL
ALT: 28 IU/L (ref 0–32)
AST: 31 IU/L (ref 0–40)
Albumin/Globulin Ratio: 1.7 (ref 1.2–2.2)
Albumin: 4.5 g/dL (ref 3.8–4.9)
Alkaline Phosphatase: 85 IU/L (ref 39–117)
BUN/Creatinine Ratio: 19 (ref 9–23)
BUN: 16 mg/dL (ref 6–24)
Bilirubin Total: 0.4 mg/dL (ref 0.0–1.2)
CO2: 26 mmol/L (ref 20–29)
Calcium: 9.7 mg/dL (ref 8.7–10.2)
Chloride: 99 mmol/L (ref 96–106)
Creatinine, Ser: 0.86 mg/dL (ref 0.57–1.00)
GFR calc Af Amer: 87 mL/min/{1.73_m2} (ref >59)
GFR calc non Af Amer: 75 mL/min/{1.73_m2} (ref >59)
Globulin, Total: 2.6 g/dL (ref 1.5–4.5)
Glucose: 68 mg/dL (ref 65–99)
Potassium: 4 mmol/L (ref 3.5–5.2)
Sodium: 140 mmol/L (ref 134–144)
Total Protein: 7.1 g/dL (ref 6.0–8.5)

## 2019-05-27 LAB — VITAMIN D 1,25 DIHYDROXY
Vitamin D 1, 25 (OH)2 Total: 80 pg/mL — ABNORMAL HIGH
Vitamin D2 1, 25 (OH)2: <10 pg/mL
Vitamin D3 1, 25 (OH)2: 80 pg/mL

## 2019-05-27 LAB — TSH: TSH: 2.25 u[IU]/mL (ref 0.450–4.500)

## 2019-06-22 ENCOUNTER — Ambulatory Visit: Payer: BC Managed Care – PPO | Admitting: Obstetrics & Gynecology

## 2019-07-06 ENCOUNTER — Encounter: Payer: Self-pay | Admitting: Obstetrics & Gynecology

## 2019-07-06 ENCOUNTER — Ambulatory Visit: Payer: BC Managed Care – PPO | Admitting: Obstetrics & Gynecology

## 2019-07-06 ENCOUNTER — Other Ambulatory Visit: Payer: Self-pay

## 2019-07-06 VITALS — BP 96/66 | HR 60 | Wt 123.0 lb

## 2019-07-06 DIAGNOSIS — F329 Major depressive disorder, single episode, unspecified: Secondary | ICD-10-CM

## 2019-07-06 DIAGNOSIS — F32A Depression, unspecified: Secondary | ICD-10-CM

## 2019-07-06 DIAGNOSIS — R5383 Other fatigue: Secondary | ICD-10-CM

## 2019-07-06 NOTE — Progress Notes (Signed)
Patient ID: Patricia Huerta, female   DOB: 07-14-1960, 59 y.o.   MRN: [id number]      Chief Complaint  Patient presents with  . Follow-up      59 y.o. G0P0000 No LMP recorded. Patient is postmenopausal. The current method of family planning is post menopausal status.  Outpatient Encounter Medications as of 07/06/2019  Medication Sig  . flintstones complete (FLINTSTONES) 60 MG chewable tablet Chew 1 tablet by mouth daily.  Marland Kitchen escitalopram (LEXAPRO) 10 MG tablet Take 1 tablet (10 mg total) by mouth daily.  Marland Kitchen zolpidem (AMBIEN) 5 MG tablet Take 1 tablet (5 mg total) by mouth at bedtime as needed for sleep. (Patient not taking: Reported on 07/06/2019)   No facility-administered encounter medications on file as of 07/06/2019.    Subjective Pt is seen for follow up from visit 12/17 She is somewhat to significantly improved Sleeping is better anxiety is better Coping has improved Past Medical History:  Diagnosis Date  . Allergy   . Diabetes mellitus without complication ([place])    pre- diabetic, no meds 04-09-16  . Hematuria 03/08/2015  . Hot flashes 03/08/2015  . PMB (postmenopausal bleeding) 03/08/2015  . Seizures ([place])    as an infant, child and in high school.  Have not had seizure since high school per pt.  . Urinary frequency 03/08/2015    Past Surgical History:  Procedure Laterality Date  . BLADDER SURGERY     bladder was stretched  . TONSILLECTOMY    . WISDOM TOOTH EXTRACTION      OB History    Gravida  0   Para  0   Term  0   Preterm  0   AB  0   Living  0     SAB  0   TAB  0   Ectopic  0   Multiple  0   Live Births              Allergies  Allergen Reactions  . Novocain [Procaine] Other (See Comments)    Pt states that she passed out at the dentist once.     Social History   Socioeconomic History  . Marital status: Single    Spouse name: Not on file  . Number of children: Not on file  . Years of education: Not on file  . Highest education  level: Not on file  Occupational History  . Not on file  Tobacco Use  . Smoking status: Never Smoker  . Smokeless tobacco: Never Used  Vaping Use  . Vaping Use: Never used  Substance and Sexual Activity  . Alcohol use: Yes    Alcohol/week: 2.0 standard drinks    Types: 2 Glasses of wine per week    Comment: occ  . Drug use: No  . Sexual activity: Yes    Birth control/protection: Post-menopausal  Other Topics Concern  . Not on file  Social History Narrative  . Not on file   Social Determinants of Health   Financial Resource Strain:   . Difficulty of Paying Living Expenses:   Food Insecurity:   . Worried About Charity fundraiser in the Last Year:   . Arboriculturist in the Last Year:   Transportation Needs:   . Film/video editor (Medical):   Marland Kitchen Lack of Transportation (Non-Medical):   Physical Activity:   . Days of Exercise per Week:   . Minutes of Exercise per Session:   Stress:   .  Feeling of Stress :   Social Connections:   . Frequency of Communication with Friends and Family:   . Frequency of Social Gatherings with Friends and Family:   . Attends Religious Services:   . Active Member of Clubs or Organizations:   . Attends Banker Meetings:   Marland Kitchen Marital Status:     Family History  Problem Relation Age of Onset  . Colon cancer Father   . Other Father        leaky heart valve  . Cancer Paternal Grandmother        colon, pancreatic  . Colon cancer Paternal Grandmother   . Pancreatic cancer Paternal Grandmother   . Colon cancer Cousin   . Esophageal cancer Neg Hx   . Rectal cancer Neg Hx   . Stomach cancer Neg Hx     Medications:       Current Outpatient Medications:  .  flintstones complete (FLINTSTONES) 60 MG chewable tablet, Chew 1 tablet by mouth daily., Disp: , Rfl:  .  escitalopram (LEXAPRO) 10 MG tablet, Take 1 tablet (10 mg total) by mouth daily., Disp: 30 tablet, Rfl: 11 .  zolpidem (AMBIEN) 5 MG tablet, Take 1 tablet (5 mg total)  by mouth at bedtime as needed for sleep. (Patient not taking: Reported on 07/06/2019), Disp: 30 tablet, Rfl: 0  Objective Blood pressure 96/66, pulse 60, weight 123 lb (55.8 kg).  Gen WDWN NAD  Pertinent ROS   Labs or studies     Impression Diagnoses this Encounter::   ICD-10-CM   1. Fatigue due to depression  F32.9    R53.83     Established relevant diagnosis(es):   Plan/Recommendations: No orders of the defined types were placed in this encounter.   Labs or Scans Ordered: No orders of the defined types were placed in this encounter.   Management:: Continue current management plan If needs follow up pt csend MyChart message  Follow up Return if symptoms worsen or fail to improve.        Face to face time:  20 minutes  Greater than 50% of the visit time was spent in counseling and coordination of care with the patient.  The summary and outline of the counseling and care coordination is summarized in the note above.   All questions were answered.

## 2019-07-07 ENCOUNTER — Ambulatory Visit: Payer: BC Managed Care – PPO

## 2019-08-10 ENCOUNTER — Ambulatory Visit
Admission: RE | Admit: 2019-08-10 | Discharge: 2019-08-10 | Disposition: A | Discharge status: Home / Self Care | Payer: BC Managed Care – PPO | Source: Ambulatory Visit | Attending: Obstetrics & Gynecology | Admitting: Obstetrics & Gynecology

## 2019-08-10 ENCOUNTER — Other Ambulatory Visit: Payer: Self-pay

## 2019-08-10 DIAGNOSIS — Z1231 Encounter for screening mammogram for malignant neoplasm of breast: Secondary | ICD-10-CM

## 2020-06-09 ENCOUNTER — Other Ambulatory Visit: Payer: BC Managed Care – PPO

## 2020-06-09 DIAGNOSIS — Z20822 Contact with and (suspected) exposure to covid-19: Secondary | ICD-10-CM

## 2020-06-09 NOTE — Addendum Note (Signed)
Addended by: Hilda Lias on: 06/09/2020 11:39 AM   Modules accepted: Orders

## 2020-06-12 LAB — NOVEL CORONAVIRUS, NAA: SARS-CoV-2, NAA: NOT DETECTED

## 2020-09-09 IMAGING — MG DIGITAL SCREENING BILAT W/ CAD
4 series · 4 of 4 positions shown · non-contrast
Comparison: Previous exam(s).

CLINICAL DATA: Screening.

EXAM:
DIGITAL SCREENING BILATERAL MAMMOGRAM WITH CAD

[L MLO]
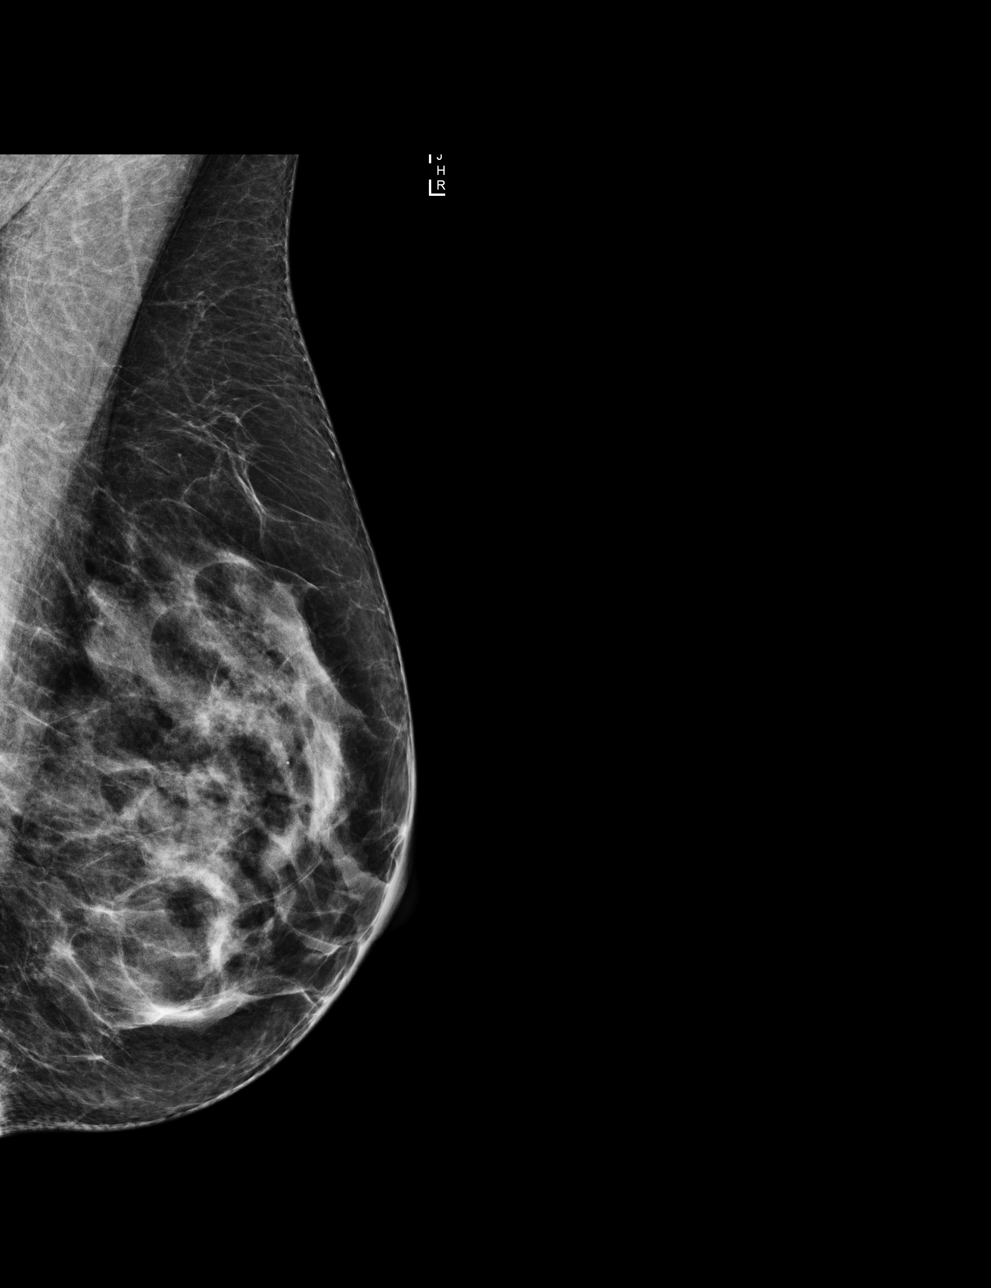

[L CC]
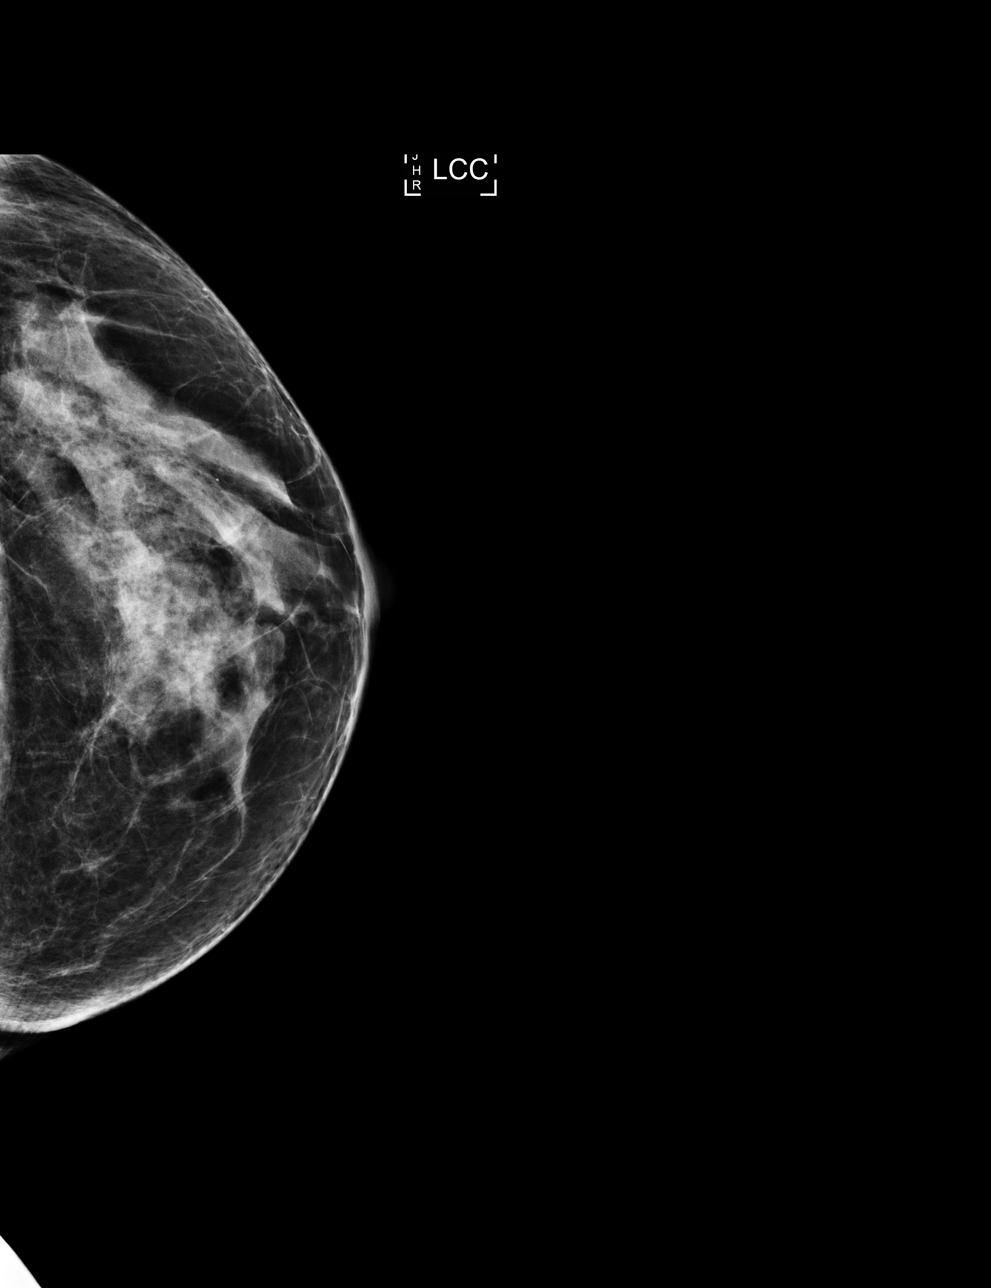

[R MLO]
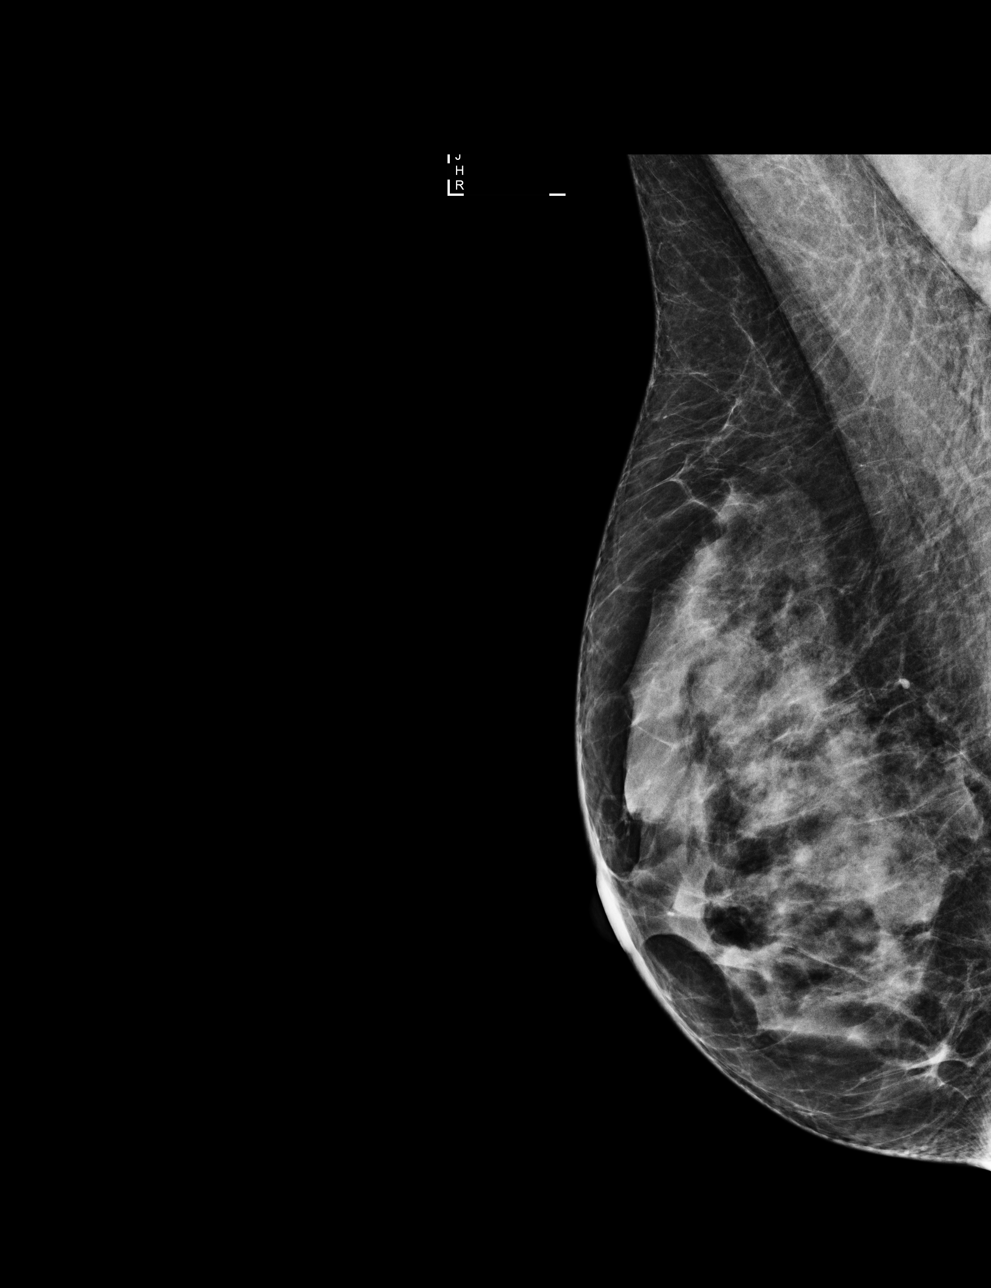

[R CC]
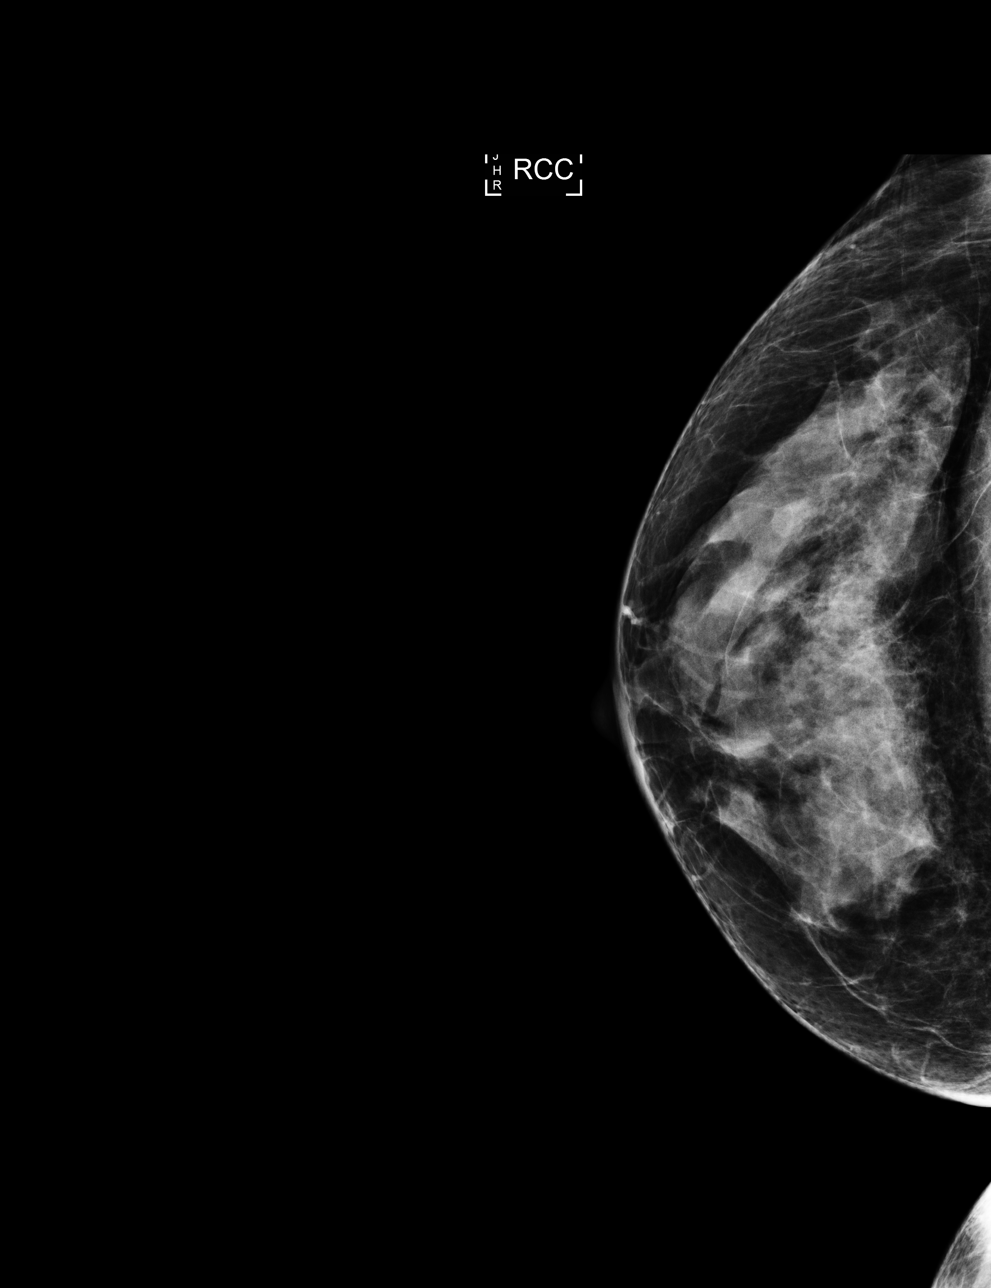

[4 of 4 positions shown; findings below may reference images not displayed]

ACR Breast Density Category d: The breast tissue is extremely dense,
which lowers the sensitivity of mammography
FINDINGS: There are no findings suspicious for malignancy. Images were
processed with CAD.
IMPRESSION: No mammographic evidence of malignancy. A result letter of this
screening mammogram will be mailed directly to the patient.

RECOMMENDATION:
Screening mammogram in one year. (Code:A5-2-HPS)

BI-RADS CATEGORY  1: Negative.

## 2021-07-06 ENCOUNTER — Encounter: Payer: Self-pay | Admitting: Internal Medicine

## 2021-08-07 ENCOUNTER — Encounter: Payer: Self-pay | Admitting: Obstetrics & Gynecology

## 2021-08-07 ENCOUNTER — Ambulatory Visit (INDEPENDENT_AMBULATORY_CARE_PROVIDER_SITE_OTHER): Payer: BC Managed Care – PPO | Admitting: Obstetrics & Gynecology

## 2021-08-07 ENCOUNTER — Other Ambulatory Visit: Payer: Self-pay

## 2021-08-07 ENCOUNTER — Other Ambulatory Visit (HOSPITAL_COMMUNITY)
Admission: RE | Admit: 2021-08-07 | Discharge: 2021-08-07 | Disposition: A | Discharge status: Home / Self Care | Payer: BC Managed Care – PPO | Source: Ambulatory Visit | Attending: Obstetrics & Gynecology | Admitting: Obstetrics & Gynecology

## 2021-08-07 VITALS — BP 90/58 | HR 63 | Ht 65.0 in | Wt 123.5 lb

## 2021-08-07 DIAGNOSIS — Z01419 Encounter for gynecological examination (general) (routine) without abnormal findings: Secondary | ICD-10-CM | POA: Insufficient documentation

## 2021-08-07 DIAGNOSIS — R7303 Prediabetes: Secondary | ICD-10-CM | POA: Diagnosis not present

## 2021-08-07 DIAGNOSIS — Z1212 Encounter for screening for malignant neoplasm of rectum: Secondary | ICD-10-CM

## 2021-08-07 DIAGNOSIS — Z1211 Encounter for screening for malignant neoplasm of colon: Secondary | ICD-10-CM

## 2021-08-07 DIAGNOSIS — E038 Other specified hypothyroidism: Secondary | ICD-10-CM

## 2021-08-07 LAB — HEMOCCULT GUIAC POC 1CARD (OFFICE): Fecal Occult Blood, POC: NEGATIVE

## 2021-08-08 LAB — TSH: TSH: 3.01 u[IU]/mL (ref 0.450–4.500)

## 2021-08-08 LAB — HEMOGLOBIN A1C
Est. average glucose Bld gHb Est-mCnc: 123 mg/dL
Hgb A1c MFr Bld: 5.9 % — ABNORMAL HIGH (ref 4.8–5.6)

## 2021-08-10 LAB — CYTOLOGY - PAP
Comment: NEGATIVE
Diagnosis: NEGATIVE
High risk HPV: NEGATIVE

## 2022-02-28 ENCOUNTER — Encounter: Payer: Self-pay | Admitting: Obstetrics & Gynecology

## 2022-02-28 NOTE — Progress Notes (Signed)
Subjective:     Patricia Huerta is a 61 y.o. female here for a routine exam.  No LMP recorded. Patient is postmenopausal. G0P0000 Birth Control Method:  menopausal Menstrual Calendar(currently): amenorrhea  Current complaints: vasomotor symptoms.   Current acute medical issues:  none   Recent Gynecologic History No LMP recorded. Patient is postmenopausal. Last Pap: 12/20,  normal Last mammogram: 3/21,  normal  Past Medical History:  Diagnosis Date   Allergy    COVID    Diabetes mellitus without complication ([place])    pre- diabetic, no meds 04-09-16   Hematuria 03/08/2015   Hot flashes 03/08/2015   PMB (postmenopausal bleeding) 03/08/2015   Seizures ([place])    as an infant, child and in high school.  Have not had seizure since high school per pt.   Urinary frequency 03/08/2015    Past Surgical History:  Procedure Laterality Date   BLADDER SURGERY     bladder was stretched   TONSILLECTOMY     WISDOM TOOTH EXTRACTION      OB History     Gravida  0   Para  0   Term  0   Preterm  0   AB  0   Living  0      SAB  0   IAB  0   Ectopic  0   Multiple  0   Live Births              Social History   Socioeconomic History   Marital status: Single    Spouse name: Not on file   Number of children: Not on file   Years of education: Not on file   Highest education level: Not on file  Occupational History   Not on file  Tobacco Use   Smoking status: Never   Smokeless tobacco: Never  Vaping Use   Vaping Use: Never used  Substance and Sexual Activity   Alcohol use: Yes    Alcohol/week: 2.0 standard drinks of alcohol    Types: 2 Glasses of wine per week    Comment: occ   Drug use: No   Sexual activity: Not Currently    Birth control/protection: Post-menopausal  Other Topics Concern   Not on file  Social History Narrative   Not on file   Social Determinants of Health   Financial Resource Strain: Low Risk  (08/07/2021)   Overall Financial Resource  Strain (CARDIA)    Difficulty of Paying Living Expenses: Not hard at all  Food Insecurity: No Food Insecurity (08/07/2021)   Hunger Vital Sign    Worried About Running Out of Food in the Last Year: Never true    Ran Out of Food in the Last Year: Never true  Transportation Needs: No Transportation Needs (08/07/2021)   PRAPARE - Hydrologist (Medical): No    Lack of Transportation (Non-Medical): No  Physical Activity: Insufficiently Active (08/07/2021)   Exercise Vital Sign    Days of Exercise per Week: 3 days    Minutes of Exercise per Session: 20 min  Stress: Stress Concern Present (08/07/2021)   [place]    Feeling of Stress : Rather much  Social Connections: Moderately Isolated (08/07/2021)   Social Connection and Isolation Panel [NHANES]    Frequency of Communication with Friends and Family: Three times a week    Frequency of Social Gatherings with Friends and Family: Once a week  Attends Religious Services: More than 4 times per year    Active Member of Clubs or Organizations: No    Attends Banker Meetings: Never    Marital Status: Separated    Family History  Problem Relation Age of Onset   Colon cancer Father    Other Father        leaky heart valve   Cancer Paternal Grandmother        colon, pancreatic   Colon cancer Paternal Grandmother    Pancreatic cancer Paternal Grandmother    Colon cancer Cousin    Esophageal cancer Neg Hx    Rectal cancer Neg Hx    Stomach cancer Neg Hx      Current Outpatient Medications:    flintstones complete (FLINTSTONES) 60 MG chewable tablet, Chew 1 tablet by mouth daily., Disp: , Rfl:   Review of Systems  Review of Systems  Constitutional: Negative for fever, chills, weight loss, malaise/fatigue and diaphoresis.  HENT: Negative for hearing loss, ear pain, nosebleeds, congestion, sore throat, neck pain, tinnitus and ear  discharge.   Eyes: Negative for blurred vision, double vision, photophobia, pain, discharge and redness.  Respiratory: Negative for cough, hemoptysis, sputum production, shortness of breath, wheezing and stridor.   Cardiovascular: Negative for chest pain, palpitations, orthopnea, claudication, leg swelling and PND.  Gastrointestinal: negative for abdominal pain. Negative for heartburn, nausea, vomiting, diarrhea, constipation, blood in stool and melena.  Genitourinary: Negative for dysuria, urgency, frequency, hematuria and flank pain.  Musculoskeletal: Negative for myalgias, back pain, joint pain and falls.  Skin: Negative for itching and rash.  Neurological: Negative for dizziness, tingling, tremors, sensory change, speech change, focal weakness, seizures, loss of consciousness, weakness and headaches.  Endo/Heme/Allergies: Negative for environmental allergies and polydipsia. Does not bruise/bleed easily.  Psychiatric/Behavioral: Negative for depression, suicidal ideas, hallucinations, memory loss and substance abuse. The patient is not nervous/anxious and does not have insomnia.        Objective:  Blood pressure (!) 90/58, pulse 63, height 5\' 5"  (1.651 m), weight 123 lb 8 oz (56 kg).   Physical Exam  Vitals reviewed. Constitutional: She is oriented to person, place, and time. She appears well-developed and well-nourished.  HENT:  Head: Normocephalic and atraumatic.        Right Ear: External ear normal.  Left Ear: External ear normal.  Nose: Nose normal.  Mouth/Throat: Oropharynx is clear and moist.  Eyes: Conjunctivae and EOM are normal. Pupils are equal, round, and reactive to light. Right eye exhibits no discharge. Left eye exhibits no discharge. No scleral icterus.  Neck: Normal range of motion. Neck supple. No tracheal deviation present. No thyromegaly present.  Cardiovascular: Normal rate, regular rhythm, normal heart sounds and intact distal pulses.  Exam reveals no gallop and no  friction rub.   No murmur heard. Respiratory: Effort normal and breath sounds normal. No respiratory distress. She has no wheezes. She has no rales. She exhibits no tenderness.  GI: Soft. Bowel sounds are normal. She exhibits no distension and no mass. There is no tenderness. There is no rebound and no guarding.  Genitourinary:  Breasts no masses skin changes or nipple changes bilaterally      Vulva is normal without lesions Vagina is pink moist without discharge atrophic Cervix normal in appearance and pap is not done Uterus is normal size shape and contour Adnexa is negative with normal sized ovaries  Rectal heme negative normal tone Musculoskeletal: Normal range of motion. She exhibits no edema and no  tenderness.  Neurological: She is alert and oriented to person, place, and time. She has normal reflexes. She displays normal reflexes. No cranial nerve deficit. She exhibits normal muscle tone. Coordination normal.  Skin: Skin is warm and dry. No rash noted. No erythema. No pallor.  Psychiatric: She has a normal mood and affect. Her behavior is normal. Judgment and thought content normal.       Medications Ordered at today's visit: No orders of the defined types were placed in this encounter.   Other orders placed at today's visit: Orders Placed This Encounter  Procedures   TSH   Hemoglobin A1C   POCT occult blood stool      Assessment:    Normal Gyn exam.      ICD-10-CM   1. Encounter for gynecological examination with Papanicolaou smear of cervix  Z01.419 Cytology - PAP( [place])    2. Screening for colorectal cancer  Z12.11 POCT occult blood stool   Z12.12     3. Other specified hypothyroidism  E03.8 TSH    4. Borderline diabetes  R73.03 Hemoglobin A1C      Plan:    Follow up in: 3 years.     Return in about 3 years (around 08/07/2024), or if symptoms worsen or fail to improve, for yearly.

## 2022-09-03 ENCOUNTER — Encounter: Payer: Self-pay | Admitting: Internal Medicine

## 2022-09-17 ENCOUNTER — Ambulatory Visit (AMBULATORY_SURGERY_CENTER): Payer: BC Managed Care – PPO

## 2022-09-17 ENCOUNTER — Encounter: Payer: Self-pay | Admitting: Internal Medicine

## 2022-09-17 VITALS — Ht 65.0 in | Wt 125.0 lb

## 2022-09-17 DIAGNOSIS — Z800 Family history of malignant neoplasm of digestive organs: Secondary | ICD-10-CM

## 2022-09-17 NOTE — Progress Notes (Signed)
No egg or soy allergy known to patient  No issues known to pt with past sedation with any surgeries or procedures Patient denies ever being told they had issues or difficulty with intubation  No FH of Malignant Hyperthermia Pt is not on diet pills Pt is not on  home 02  Pt is not on blood thinners  Pt denies issues with constipation  No A fib or A flutter Have any cardiac testing pending--no Pt instructed to use Singlecare.[email or web address] or GoodRx for a price reduction on prep  Patient's chart reviewed by Patricia Huerta CNRA prior to previsit and patient appropriate for the [facility].  Previsit completed and red dot placed by patient's name on their procedure day (on provider's schedule).    

## 2022-09-27 ENCOUNTER — Encounter: Payer: Self-pay | Admitting: Obstetrics & Gynecology

## 2022-09-27 ENCOUNTER — Ambulatory Visit (INDEPENDENT_AMBULATORY_CARE_PROVIDER_SITE_OTHER): Payer: BC Managed Care – PPO | Admitting: Obstetrics & Gynecology

## 2022-09-27 VITALS — BP 93/62 | HR 61 | Ht 65.0 in | Wt 124.0 lb

## 2022-09-27 DIAGNOSIS — Z136 Encounter for screening for cardiovascular disorders: Secondary | ICD-10-CM

## 2022-09-27 DIAGNOSIS — Z01419 Encounter for gynecological examination (general) (routine) without abnormal findings: Secondary | ICD-10-CM | POA: Diagnosis not present

## 2022-09-27 DIAGNOSIS — Z1231 Encounter for screening mammogram for malignant neoplasm of breast: Secondary | ICD-10-CM

## 2022-09-27 MED ORDER — TRAZODONE HCL 50 MG PO TABS: 50.0000 mg | ORAL_TABLET | Freq: Every day | ORAL | 11 refills | Status: AC

## 2022-09-27 NOTE — Progress Notes (Signed)
Subjective:     Patricia Huerta is a 62 y.o. female here for a routine exam.  No LMP recorded. Patient is postmenopausal. G0P0000 Birth Control Method:  menopausal Menstrual Calendar(currently): amenorrhea  Current complaints: insomnia.   Current acute medical issues:  none   Recent Gynecologic History No LMP recorded. Patient is postmenopausal. Last Pap: 2023,  normal Last mammogram: 2021  normal  Past Medical History:  Diagnosis Date   Allergy    COVID    Diabetes mellitus without complication    pre- diabetic, no meds 04-09-16   Hematuria 03/08/2015   Hot flashes 03/08/2015   PMB (postmenopausal bleeding) 03/08/2015   Seizures    as an infant, child and in high school.  Have not had seizure since high school per pt.   Urinary frequency 03/08/2015    Past Surgical History:  Procedure Laterality Date   BLADDER SURGERY     bladder was stretched   COLONOSCOPY     TONSILLECTOMY     WISDOM TOOTH EXTRACTION      OB History     Gravida  0   Para  0   Term  0   Preterm  0   AB  0   Living  0      SAB  0   IAB  0   Ectopic  0   Multiple  0   Live Births              Social History   Socioeconomic History   Marital status: Single    Spouse name: Not on file   Number of children: Not on file   Years of education: Not on file   Highest education level: Not on file  Occupational History   Not on file  Tobacco Use   Smoking status: Never   Smokeless tobacco: Never  Vaping Use   Vaping Use: Never used  Substance and Sexual Activity   Alcohol use: Yes    Alcohol/week: 2.0 standard drinks of alcohol    Types: 2 Glasses of wine per week    Comment: occ   Drug use: No   Sexual activity: Not Currently    Birth control/protection: Post-menopausal  Other Topics Concern   Not on file  Social History Narrative   Not on file   Social Determinants of Health   Financial Resource Strain: Low Risk  (09/27/2022)   Overall Financial Resource  Strain (CARDIA)    Difficulty of Paying Living Expenses: Not hard at all  Food Insecurity: No Food Insecurity (09/27/2022)   Hunger Vital Sign    Worried About Running Out of Food in the Last Year: Never true    Ran Out of Food in the Last Year: Never true  Transportation Needs: No Transportation Needs (09/27/2022)   PRAPARE - Administrator, Civil Service (Medical): No    Lack of Transportation (Non-Medical): No  Physical Activity: Insufficiently Active (09/27/2022)   Exercise Vital Sign    Days of Exercise per Week: 3 days    Minutes of Exercise per Session: 20 min  Stress: Stress Concern Present (09/27/2022)   [employer] of Occupational Health - Occupational Stress Questionnaire    Feeling of Stress : Very much  Social Connections: Moderately Isolated (09/27/2022)   Social Connection and Isolation Panel [NHANES]    Frequency of Communication with Friends and Family: Three times a week    Frequency of Social Gatherings with Friends and Family: Once a  week    Attends Religious Services: More than 4 times per year    Active Member of Clubs or Organizations: No    Attends Banker Meetings: Never    Marital Status: Separated    Family History  Problem Relation Age of Onset   Colon cancer Father    Other Father        leaky heart valve   Cancer Paternal Grandmother        colon, pancreatic   Colon cancer Paternal Grandmother    Pancreatic cancer Paternal Grandmother    Colon cancer Cousin    Esophageal cancer Neg Hx    Rectal cancer Neg Hx    Stomach cancer Neg Hx      Current Outpatient Medications:    flintstones complete (FLINTSTONES) 60 MG chewable tablet, Chew 1 tablet by mouth daily., Disp: , Rfl:    MAGNESIUM PO, Take by mouth as needed (for leg cramps)., Disp: , Rfl:    traZODone (DESYREL) 50 MG tablet, Take 1 tablet (50 mg total) by mouth at bedtime., Disp: 30 tablet, Rfl: 11  Review of Systems  Review of Systems  Constitutional:  Negative for fever, chills, weight loss, malaise/fatigue and diaphoresis.  HENT: Negative for hearing loss, ear pain, nosebleeds, congestion, sore throat, neck pain, tinnitus and ear discharge.   Eyes: Negative for blurred vision, double vision, photophobia, pain, discharge and redness.  Respiratory: Negative for cough, hemoptysis, sputum production, shortness of breath, wheezing and stridor.   Cardiovascular: Negative for chest pain, palpitations, orthopnea, claudication, leg swelling and PND.  Gastrointestinal: negative for abdominal pain. Negative for heartburn, nausea, vomiting, diarrhea, constipation, blood in stool and melena.  Genitourinary: Negative for dysuria, urgency, frequency, hematuria and flank pain.  Musculoskeletal: Negative for myalgias, back pain, joint pain and falls.  Skin: Negative for itching and rash.  Neurological: Negative for dizziness, tingling, tremors, sensory change, speech change, focal weakness, seizures, loss of consciousness, weakness and headaches.  Endo/Heme/Allergies: Negative for environmental allergies and polydipsia. Does not bruise/bleed easily.  Psychiatric/Behavioral: Negative for depression, suicidal ideas, hallucinations, memory loss and substance abuse. The patient is not nervous/anxious and does not have insomnia.        Objective:  Blood pressure 93/62, pulse 61, height 5\' 5"  (1.651 m), weight 124 lb (56.2 kg).   Physical Exam  Vitals reviewed. Constitutional: She is oriented to person, place, and time. She appears well-developed and well-nourished.  HENT:  Head: Normocephalic and atraumatic.        Right Ear: External ear normal.  Left Ear: External ear normal.  Nose: Nose normal.  Mouth/Throat: Oropharynx is clear and moist.  Eyes: Conjunctivae and EOM are normal. Pupils are equal, round, and reactive to light. Right eye exhibits no discharge. Left eye exhibits no discharge. No scleral icterus.  Neck: Normal range of motion. Neck supple.  No tracheal deviation present. No thyromegaly present.  Cardiovascular: Normal rate, regular rhythm, normal heart sounds and intact distal pulses.  Exam reveals no gallop and no friction rub.   No murmur heard. Respiratory: Effort normal and breath sounds normal. No respiratory distress. She has no wheezes. She has no rales. She exhibits no tenderness.  GI: Soft. Bowel sounds are normal. She exhibits no distension and no mass. There is no tenderness. There is no rebound and no guarding.  Genitourinary:  Breasts no masses skin changes or nipple changes bilaterally      Vulva is normal without lesions Vagina is pink moist without discharge Cervix normal  in appearance and pap is done Uterus is normal size shape and contour Adnexa is negative with normal sized ovaries   Musculoskeletal: Normal range of motion. She exhibits no edema and no tenderness.  Neurological: She is alert and oriented to person, place, and time. She has normal reflexes. She displays normal reflexes. No cranial nerve deficit. She exhibits normal muscle tone. Coordination normal.  Skin: Skin is warm and dry. No rash noted. No erythema. No pallor.  Psychiatric: She has a normal mood and affect. Her behavior is normal. Judgment and thought content normal.       Medications Ordered at today's visit: Meds ordered this encounter  Medications   traZODone (DESYREL) 50 MG tablet    Sig: Take 1 tablet (50 mg total) by mouth at bedtime.    Dispense:  30 tablet    Refill:  11    Other orders placed at today's visit: Orders Placed This Encounter  Procedures   MM 3D SCREENING MAMMOGRAM BILATERAL BREAST   Lipid Panel With LDL/HDL Ratio   Hemoglobin A1C   TSH   Comp Met (CMET)   CBC   Vitamin D 1,25 dihydroxy      Assessment:    Normal Gyn exam.   Chronic insomnia Plan:    Follow up 2 years For cervical cytology  Mammogram ordered Labs ordered Trial trazadone     No follow-ups on file.

## 2022-09-28 LAB — CBC
Hematocrit: 40.1 % (ref 34.0–46.6)
MCH: 29.5 pg (ref 26.6–33.0)
MCHC: 32.7 g/dL (ref 31.5–35.7)
MCV: 90 fL (ref 79–97)

## 2022-09-28 LAB — COMPREHENSIVE METABOLIC PANEL
AST: 36 IU/L (ref 0–40)
Albumin/Globulin Ratio: 1.8 (ref 1.2–2.2)
Calcium: 9.7 mg/dL (ref 8.7–10.3)
Chloride: 101 mmol/L (ref 96–106)
Creatinine, Ser: 0.96 mg/dL (ref 0.57–1.00)
Globulin, Total: 2.5 g/dL (ref 1.5–4.5)
Glucose: 83 mg/dL (ref 70–99)
eGFR: 68 mL/min/{1.73_m2} (ref >59)

## 2022-09-28 LAB — VITAMIN D 1,25 DIHYDROXY

## 2022-09-28 LAB — HEMOGLOBIN A1C: Hgb A1c MFr Bld: 5.9 % — ABNORMAL HIGH (ref 4.8–5.6)

## 2022-10-04 LAB — LIPID PANEL WITH LDL/HDL RATIO
Cholesterol, Total: 201 mg/dL — ABNORMAL HIGH (ref 100–199)
HDL: 100 mg/dL (ref >39)
LDL Chol Calc (NIH): 90 mg/dL (ref 0–99)
LDL/HDL Ratio: 0.9 ratio (ref 0.0–3.2)
Triglycerides: 62 mg/dL (ref 0–149)
VLDL Cholesterol Cal: 11 mg/dL (ref 5–40)

## 2022-10-04 LAB — COMPREHENSIVE METABOLIC PANEL
ALT: 32 IU/L (ref 0–32)
Albumin: 4.6 g/dL (ref 3.8–4.9)
Alkaline Phosphatase: 83 IU/L (ref 44–121)
BUN/Creatinine Ratio: 18 (ref 12–28)
BUN: 17 mg/dL (ref 8–27)
Bilirubin Total: 0.3 mg/dL (ref 0.0–1.2)
CO2: 25 mmol/L (ref 20–29)
Potassium: 4.1 mmol/L (ref 3.5–5.2)
Sodium: 142 mmol/L (ref 134–144)
Total Protein: 7.1 g/dL (ref 6.0–8.5)

## 2022-10-04 LAB — CBC
Hemoglobin: 13.1 g/dL (ref 11.1–15.9)
Platelets: 326 10*3/uL (ref 150–450)
RBC: 4.44 x10E6/uL (ref 3.77–5.28)
RDW: 12.9 % (ref 11.7–15.4)
WBC: 8.3 10*3/uL (ref 3.4–10.8)

## 2022-10-04 LAB — VITAMIN D 1,25 DIHYDROXY: Vitamin D3 1, 25 (OH)2: 74 pg/mL

## 2022-10-04 LAB — TSH: TSH: 2.24 u[IU]/mL (ref 0.450–4.500)

## 2022-10-04 LAB — HEMOGLOBIN A1C: Est. average glucose Bld gHb Est-mCnc: 123 mg/dL

## 2022-10-10 ENCOUNTER — Telehealth: Payer: Self-pay | Admitting: Gastroenterology

## 2022-10-10 NOTE — Telephone Encounter (Signed)
Patient contacted the on call provider this evening around 6 pm because she started vomiting shortly after starting her bowel prep.  She thinks she only drank about 10-12 oz before she felt suddenly nauseated and vomited multiple times.  She also developed a headache.    She has taken the dulcolax tablets and is using MiraLax and Gatorade for her prep, which is what she thinks she used for her last colonoscopy.    I advised her to resume her prep at a slower pace, and take small sips.  Also suggested adding a small amount of ice may help and that she should refrigerate the remainder of her mixture (she did not refrigerate it previously). I prescribed her Zofran ODT (called in to her [facility] pharmacy at 6:15pm which closed at 7pm.  I confirmed that they would be able to fill it by 7pm) to take as needed for the rest of her prep.

## 2022-10-11 ENCOUNTER — Ambulatory Visit (AMBULATORY_SURGERY_CENTER): Payer: BC Managed Care – PPO | Admitting: Internal Medicine

## 2022-10-11 ENCOUNTER — Encounter: Payer: Self-pay | Admitting: Internal Medicine

## 2022-10-11 VITALS — BP 94/68 | HR 52 | Temp 98.4°F | Resp 11 | Ht 65.0 in | Wt 125.0 lb

## 2022-10-11 DIAGNOSIS — Z1211 Encounter for screening for malignant neoplasm of colon: Secondary | ICD-10-CM | POA: Diagnosis present

## 2022-10-11 DIAGNOSIS — Z800 Family history of malignant neoplasm of digestive organs: Secondary | ICD-10-CM

## 2022-10-11 MED ORDER — SODIUM CHLORIDE 0.9 % IV SOLN: 500.0000 mL | INTRAVENOUS | Status: DC

## 2022-10-11 NOTE — Progress Notes (Signed)
[facility] History and Physical   Primary Care Physician:  Lazaro Arms, MD   Reason for Procedure:   Family hx colon cancer  Plan:    colonoscopy     HPI: Patricia Huerta is a 62 y.o. female for screening exam, l;ast done 2017  She had some diffiocult w/ prep - ondansetron Rxed Past Medical History:  Diagnosis Date   Allergy    COVID    Diabetes mellitus without complication (HCC)    pre- diabetic, no meds 04-09-16   Hematuria 03/08/2015   Hot flashes 03/08/2015   PMB (postmenopausal bleeding) 03/08/2015   Seizures (HCC)    as an infant, child and in high school.  Have not had seizure since high school per pt.   Urinary frequency 03/08/2015    Past Surgical History:  Procedure Laterality Date   BLADDER SURGERY     bladder was stretched   COLONOSCOPY     TONSILLECTOMY     WISDOM TOOTH EXTRACTION      Prior to Admission medications   Medication Sig Start Date End Date Taking? Authorizing Provider  flintstones complete (FLINTSTONES) 60 MG chewable tablet Chew 1 tablet by mouth daily.   Yes [provider]  MAGNESIUM PO Take by mouth as needed (for leg cramps).    [provider]  traZODone (DESYREL) 50 MG tablet Take 1 tablet (50 mg total) by mouth at bedtime. Patient not taking: Reported on 10/11/2022 09/27/22   Lazaro Arms, MD    Current Outpatient Medications  Medication Sig Dispense Refill   flintstones complete (FLINTSTONES) 60 MG chewable tablet Chew 1 tablet by mouth daily.     MAGNESIUM PO Take by mouth as needed (for leg cramps).     traZODone (DESYREL) 50 MG tablet Take 1 tablet (50 mg total) by mouth at bedtime. (Patient not taking: Reported on 10/11/2022) 30 tablet 11   Current Facility-Administered Medications  Medication Dose Route Frequency Provider Last Rate Last Admin   0.9 %  sodium chloride infusion  500 mL Intravenous Continuous Iva Boop, MD        Allergies as of 10/11/2022 - Review Complete 10/11/2022   Allergen Reaction Noted   Novocain [procaine] Other (See Comments) 07/27/2014   Epinephrine Palpitations 04/03/2018    Family History  Problem Relation Age of Onset   Colon cancer Father    Other Father        leaky heart valve   Cancer Paternal Grandmother        colon, pancreatic   Colon cancer Paternal Grandmother    Pancreatic cancer Paternal Grandmother    Colon cancer Cousin    Esophageal cancer Neg Hx    Rectal cancer Neg Hx    Stomach cancer Neg Hx     Social History   Socioeconomic History   Marital status: Single    Spouse name: Not on file   Number of children: Not on file   Years of education: Not on file   Highest education level: Not on file  Occupational History   Not on file  Tobacco Use   Smoking status: Never   Smokeless tobacco: Never  Vaping Use   Vaping Use: Never used  Substance and Sexual Activity   Alcohol use: Yes    Alcohol/week: 2.0 standard drinks of alcohol    Types: 2 Glasses of wine per week    Comment: occ   Drug use: No   Sexual activity: Not Currently  Birth control/protection: Post-menopausal  Other Topics Concern   Not on file  Social History Narrative   Not on file   Social Determinants of Health   Financial Resource Strain: Low Risk  (09/27/2022)   Overall Financial Resource Strain (CARDIA)    Difficulty of Paying Living Expenses: Not hard at all  Food Insecurity: No Food Insecurity (09/27/2022)   Hunger Vital Sign    Worried About Running Out of Food in the Last Year: Never true    Ran Out of Food in the Last Year: Never true  Transportation Needs: No Transportation Needs (09/27/2022)   PRAPARE - Administrator, Civil Service (Medical): No    Lack of Transportation (Non-Medical): No  Physical Activity: Insufficiently Active (09/27/2022)   Exercise Vital Sign    Days of Exercise per Week: 3 days    Minutes of Exercise per Session: 20 min  Stress: Stress Concern Present (09/27/2022)   [employer] of  Occupational Health - Occupational Stress Questionnaire    Feeling of Stress : Very much  Social Connections: Moderately Isolated (09/27/2022)   Social Connection and Isolation Panel [NHANES]    Frequency of Communication with Friends and Family: Three times a week    Frequency of Social Gatherings with Friends and Family: Once a week    Attends Religious Services: More than 4 times per year    Active Member of [employer] or Organizations: No    Attends Banker Meetings: Never    Marital Status: Separated  Intimate Partner Violence: Not At Risk (09/27/2022)   Humiliation, Afraid, Rape, and Kick questionnaire    Fear of Current or Ex-Partner: No    Emotionally Abused: No    Physically Abused: No    Sexually Abused: No    Review of Systems:  All other review of systems negative except as mentioned in the HPI.  Physical Exam: Vital signs BP 98/65   Pulse 60   Temp 98.4 F (36.9 C) (Skin)   Resp 13   Ht 5\' 5"  (1.651 m)   Wt 125 lb (56.7 kg)   SpO2 100%   BMI 20.80 kg/m   General:   Alert,  Well-developed, well-nourished, pleasant and cooperative in NAD Lungs:  Clear throughout to auscultation.   Heart:  Regular rate and rhythm; no murmurs, clicks, rubs,  or gallops. Abdomen:  Soft, nontender and nondistended. Normal bowel sounds.   Neuro/Psych:  Alert and cooperative. Normal mood and affect. A and O x 3   @Chayla Shands  Sena Slate, MD, [facility] [phone] (pager) 10/11/2022 8:31 AM@

## 2022-10-11 NOTE — Progress Notes (Signed)
Pt. Held in PACU until awake and alert. 

## 2022-10-11 NOTE — Telephone Encounter (Signed)
Chart reviewed. Pt had procedure today with Dr. Leone Payor as planned.

## 2022-10-11 NOTE — Progress Notes (Signed)
Uneventful anesthetic. Report to pacu rn. Vss. Care resumed by rn. 

## 2022-10-11 NOTE — Op Note (Signed)
[facility] Patient Name: Patricia Huerta Procedure Date: 10/11/2022 8:24 AM MRN: [id number] Endoscopist: Iva Boop , MD, [id number] Age: 62 Referring MD:  Date of Birth: Dec 12, 1960 Gender: Female Account #: [id number] Procedure:                Colonoscopy Indications:              Screening for colon cancer: Family history of                            colorectal cancer in multiple 2nd degree relatives,                            Screening in patient at increased risk: Colorectal                            cancer in father 36 or older Medicines:                Monitored Anesthesia Care Procedure:                Pre-Anesthesia Assessment:                           - Prior to the procedure, a History and Physical                            was performed, and patient medications and                            allergies were reviewed. The patient's tolerance of                            previous anesthesia was also reviewed. The risks                            and benefits of the procedure and the sedation                            options and risks were discussed with the patient.                            All questions were answered, and informed consent                            was obtained. Prior Anticoagulants: The patient has                            taken no anticoagulant or antiplatelet agents. ASA                            Grade Assessment: I - A normal, healthy patient.                            After reviewing the risks and benefits, the patient  was deemed in satisfactory condition to undergo the                            procedure.                           After obtaining informed consent, the colonoscope                            was passed under direct vision. Throughout the                            procedure, the patient's blood pressure, pulse, and                            oxygen saturations were monitored  continuously. The                            Olympus PCF-H190DL (#[id number]) Colonoscope was                            introduced through the anus and advanced to the the                            cecum, identified by appendiceal orifice and                            ileocecal valve. The colonoscopy was performed                            without difficulty. The patient tolerated the                            procedure well. The quality of the bowel                            preparation was adequate. The ileocecal valve,                            appendiceal orifice, and rectum were photographed.                            The bowel preparation used was Miralax via split                            dose instruction. Scope In: 8:42:15 AM Scope Out: 8:58:58 AM Scope Withdrawal Time: 0 hours 13 minutes 36 seconds  Total Procedure Duration: 0 hours 16 minutes 43 seconds  Findings:                 The perianal and digital rectal examinations were                            normal.  The entire examined colon appeared normal on direct                            and retroflexion views. Complications:            No immediate complications. Estimated Blood Loss:     Estimated blood loss: none. Impression:               - No specimens collected. Recommendation:           - Patient has a contact number available for                            emergencies. The signs and symptoms of potential                            delayed complications were discussed with the                            patient. Return to normal activities tomorrow.                            Written discharge instructions were provided to the                            patient.                           - Resume previous diet.                           - Continue present medications.                           - Repeat colonoscopy in 5 years for screening                            purposes. Consider  different prep or Reglan w/ prep                            - she struggled some. Also stricter fiber                            restriction before. Iva Boop, MD 10/11/2022 9:08:58 AM This report has been signed electronically.

## 2022-10-11 NOTE — Patient Instructions (Addendum)
No polyps or cancer seen.  Your next routine colonoscopy should be in 5 years - 2029.  I appreciate the opportunity to care for you. Iva Boop, MD, [facility]   Resume previous diet. Continue present medications.  YOU HAD AN ENDOSCOPIC PROCEDURE TODAY AT THE [facility]:   Refer to the procedure report that was given to you for any specific questions about what was found during the examination.  If the procedure report does not answer your questions, please call your gastroenterologist to clarify.  If you requested that your care partner not be given the details of your procedure findings, then the procedure report has been included in a sealed envelope for you to review at your convenience later.  YOU SHOULD EXPECT: Some feelings of bloating in the abdomen. Passage of more gas than usual.  Walking can help get rid of the air that was put into your GI tract during the procedure and reduce the bloating. If you had a lower endoscopy (such as a colonoscopy or flexible sigmoidoscopy) you may notice spotting of blood in your stool or on the toilet paper. If you underwent a bowel prep for your procedure, you may not have a normal bowel movement for a few days.  Please Note:  You might notice some irritation and congestion in your nose or some drainage.  This is from the oxygen used during your procedure.  There is no need for concern and it should clear up in a day or so.  SYMPTOMS TO REPORT IMMEDIATELY:  Following lower endoscopy (colonoscopy or flexible sigmoidoscopy):  Excessive amounts of blood in the stool  Significant tenderness or worsening of abdominal pains  Swelling of the abdomen that is new, acute  Fever of 100F or higher For urgent or emergent issues, a gastroenterologist can be reached at any hour by calling [phone]. Do not use MyChart messaging for urgent concerns.    DIET:  We do recommend a small meal at first, but then you may proceed to your regular diet.   Drink plenty of fluids but you should avoid alcoholic beverages for 24 hours.  ACTIVITY:  You should plan to take it easy for the rest of today and you should NOT DRIVE or use heavy machinery until tomorrow (because of the sedation medicines used during the test).    FOLLOW UP: Our staff will call the number listed on your records the next business day following your procedure.  We will call around 7:15- 8:00 am to check on you and address any questions or concerns that you may have regarding the information given to you following your procedure. If we do not reach you, we will leave a message.     If any biopsies were taken you will be contacted by phone or by letter within the next 1-3 weeks.  Please call us at [phone] if you have not heard about the biopsies in 3 weeks.    SIGNATURES/CONFIDENTIALITY: You and/or your care partner have signed paperwork which will be entered into your electronic medical record.  These signatures attest to the fact that that the information above on your After Visit Summary has been reviewed and is understood.  Full responsibility of the confidentiality of this discharge information lies with you and/or your care-partner.

## 2022-10-11 NOTE — Progress Notes (Signed)
Pt. states no medical or surgical changes since previsit or office visit. 

## 2022-10-12 ENCOUNTER — Telehealth: Payer: Self-pay | Admitting: *Deleted

## 2022-10-12 NOTE — Telephone Encounter (Signed)
  Follow up Call-     10/11/2022    7:53 AM  Call back number  Post procedure Call Back phone  # [phone]  Permission to leave phone message Yes     Patient questions:  Do you have a fever, pain , or abdominal swelling? No. Pain Score  0 *  Have you tolerated food without any problems? Yes.    Have you been able to return to your normal activities? Yes.    Do you have any questions about your discharge instructions: Diet   No. Medications  No. Follow up visit  No.  Do you have questions or concerns about your Care? No.  Actions: * If pain score is 4 or above: No action needed, pain <4.

## 2022-12-12 ENCOUNTER — Other Ambulatory Visit (HOSPITAL_COMMUNITY): Payer: Self-pay | Admitting: Obstetrics & Gynecology

## 2022-12-12 ENCOUNTER — Encounter: Payer: Self-pay | Admitting: Obstetrics & Gynecology

## 2022-12-12 DIAGNOSIS — Z1231 Encounter for screening mammogram for malignant neoplasm of breast: Secondary | ICD-10-CM

## 2022-12-17 ENCOUNTER — Ambulatory Visit (HOSPITAL_COMMUNITY)
Admission: RE | Admit: 2022-12-17 | Discharge: 2022-12-17 | Disposition: A | Discharge status: Home / Self Care | Payer: BC Managed Care – PPO | Source: Ambulatory Visit | Attending: Obstetrics & Gynecology | Admitting: Obstetrics & Gynecology

## 2022-12-17 ENCOUNTER — Encounter (HOSPITAL_COMMUNITY): Payer: Self-pay

## 2022-12-17 DIAGNOSIS — Z1231 Encounter for screening mammogram for malignant neoplasm of breast: Secondary | ICD-10-CM | POA: Insufficient documentation

## 2023-12-09 ENCOUNTER — Telehealth: Payer: Self-pay | Admitting: Internal Medicine

## 2023-12-09 NOTE — Telephone Encounter (Signed)
 Stool culture and O and P screen negative - I saw in [employer]  She needs next available APP appt

## 2023-12-09 NOTE — Telephone Encounter (Signed)
 Returned call to patient. She reports that for about 1 month she has been having trouble with diarrhea. She reports that it happens all of the time. Denies blood, positive for mucus. Reports stools are brown with a possible slightly yellow tinge. Positive for bloating and gurgling in her stomach. Denies fevers, nausea, vomiting. Denies any changes to appetite. Reports she went to urgent care about 2 weeks ago. They did a stool sample for culture and O&P. Patient reports these were negative. Performed at [facility]. Patient was placed on 10 day dose of Metronidazole  by urgent care doctor, completed yesterday. Reports no improvement to her symptoms.

## 2023-12-09 NOTE — Telephone Encounter (Signed)
 Patient called and stated that that she having had diarrhea for over about a month now. Patient is requesting a call back. Please advise.

## 2023-12-09 NOTE — Telephone Encounter (Signed)
 Returned call to patient. Appointment scheduled for 12/10/2023 at 10:20 with Harlene.

## 2023-12-10 ENCOUNTER — Ambulatory Visit: Payer: Self-pay | Admitting: Gastroenterology

## 2023-12-10 ENCOUNTER — Encounter: Payer: Self-pay | Admitting: Gastroenterology

## 2023-12-10 ENCOUNTER — Other Ambulatory Visit (INDEPENDENT_AMBULATORY_CARE_PROVIDER_SITE_OTHER)

## 2023-12-10 VITALS — BP 110/62 | HR 79 | Ht 65.0 in | Wt 122.2 lb

## 2023-12-10 DIAGNOSIS — R197 Diarrhea, unspecified: Secondary | ICD-10-CM | POA: Diagnosis not present

## 2023-12-10 DIAGNOSIS — A09 Infectious gastroenteritis and colitis, unspecified: Secondary | ICD-10-CM

## 2023-12-10 LAB — TSH: TSH: 2.14 u[IU]/mL (ref 0.35–5.50)

## 2023-12-10 NOTE — Patient Instructions (Signed)
 Your provider has requested that you go to the basement level for lab work before leaving today. Press B on the elevator. The lab is located at the first door on the left as you exit the elevator.  _______________________________________________________  If your blood pressure at your visit was 140/90 or greater, please contact your primary care physician to follow up on this.  _______________________________________________________  If you are age 62 or older, your body mass index should be between 23-30. Your Body mass index is 20.34 kg/m. If this is out of the aforementioned range listed, please consider follow up with your Primary Care Provider.  If you are age 12 or younger, your body mass index should be between 19-25. Your Body mass index is 20.34 kg/m. If this is out of the aformentioned range listed, please consider follow up with your Primary Care Provider.   ________________________________________________________  The [facility] providers would like to encourage you to use MYCHART to communicate with providers for non-urgent requests or questions.  Due to long hold times on the telephone, sending your provider a message by [facility] may be a faster and more efficient way to get a response.  Please allow 48 business hours for a response.  Please remember that this is for non-urgent requests.  _______________________________________________________

## 2023-12-10 NOTE — Progress Notes (Signed)
 12/10/2023 Patricia Huerta [id number] [birth year]   HISTORY OF PRESENT ILLNESS: This is a 63 year old female who is a patient of Dr. Darilyn.  She has limited past medical history.  Says that about a month or so ago she started having watery diarrhea.  Says that every time she urinates she passes little pieces of stool.  She is having 10 or more bowel movements a day.  Passing stool at nighttime although not necessarily woken up by diarrhea, more wakes up with the urge to have urinate.  Maybe saw some mucus once in the stool, no blood.  Says that historically she has not had any stomach or GI issues.  No pain per se, but just a lot of diffuse bloating.  Stool for ova and parasites/Giardia and culture at urgent care were negative.  Was treated empirically with a course of Flagyl , which did not help.  Otherwise has not been on any antibiotics.  No new medications or changes in diet.  She was on a cruise in April but otherwise no other travel.  Complains of feeling very tired as well.  Colonoscopy 10/2022:  Normal.  Past Medical History:  Diagnosis Date   Allergy    COVID    Diabetes mellitus without complication (HCC)    pre- diabetic, no meds 04-09-16   Hematuria 03/08/2015   Hot flashes 03/08/2015   PMB (postmenopausal bleeding) 03/08/2015   Seizures (HCC)    as an infant, child and in high school.  Have not had seizure since high school per pt.   Urinary frequency 03/08/2015   Past Surgical History:  Procedure Laterality Date   BLADDER SURGERY     bladder was stretched   COLONOSCOPY     TONSILLECTOMY     WISDOM TOOTH EXTRACTION      reports that she has never smoked. She has never used smokeless tobacco. She reports current alcohol use of about 2.0 standard drinks of alcohol per week. She reports that she does not use drugs. family history includes Cancer in her paternal grandmother; Colon cancer in her cousin, father, and paternal grandmother; Other in her father; Pancreatic  cancer in her paternal grandmother. Allergies  Allergen Reactions   Novocain [Procaine] Other (See Comments)    Pt states that she passed out at the dentist once.    Epinephrine Palpitations      Outpatient Encounter Medications as of 12/10/2023  Medication Sig   flintstones complete (FLINTSTONES) 60 MG chewable tablet Chew 1 tablet by mouth daily.   MAGNESIUM PO Take by mouth as needed (for leg cramps). (Patient not taking: Reported on 12/10/2023)   traZODone  (DESYREL ) 50 MG tablet Take 1 tablet (50 mg total) by mouth at bedtime. (Patient not taking: Reported on 12/10/2023)   No facility-administered encounter medications on file as of 12/10/2023.    REVIEW OF SYSTEMS  : All other systems reviewed and negative except where noted in the History of Present Illness.   PHYSICAL EXAM: BP 110/62 (BP Location: Right Arm, Patient Position: Sitting, Cuff Size: Normal)   Pulse 79   Ht 5' 5 (1.651 m)   Wt 122 lb 4 oz (55.5 kg)   BMI 20.34 kg/m  General: Well developed white female in no acute distress Head: Normocephalic and atraumatic Eyes:  Sclerae anicteric, conjunctiva pink. Ears: Normal auditory acuity Lungs: Clear throughout to auscultation; no W/R/R. Heart: Regular rate and rhythm; no M/R/G. Abdomen: Soft, non-distended.  BS present.  Minimal diffuse TTP. Musculoskeletal: Symmetrical with no  gross deformities  Skin: No lesions on visible extremities Extremities: No edema  Neurological: Alert oriented x 4, grossly non-focal Psychological:  Alert and cooperative. Normal mood and affect  ASSESSMENT AND PLAN: *Diarrhea, present only for about the past month or so.  No antibiotic use except for Flagyl  which was used to treat this diarrhea.  Was on a cruise back in April.  No new medications or dietary changes.  Never had diarrhea or GI symptoms previously.  Ova and parasites and Giardia and stool culture negative.  Will check a C. difficile and a fecal calprotectin.  Also check celiac  labs and TSH.  Can use Imodium if needed/desired.  Further plans pending results.   CC:  Jayne Vonn DEL, MD

## 2023-12-12 LAB — TISSUE TRANSGLUTAMINASE ABS,IGG,IGA
(tTG) Ab, IgA: <1 U/mL
(tTG) Ab, IgG: 2.1 U/mL

## 2023-12-12 LAB — IGA: Immunoglobulin A: 165 mg/dL (ref 70–320)

## 2023-12-13 ENCOUNTER — Other Ambulatory Visit: Payer: Self-pay

## 2023-12-13 ENCOUNTER — Other Ambulatory Visit

## 2023-12-13 DIAGNOSIS — A09 Infectious gastroenteritis and colitis, unspecified: Secondary | ICD-10-CM

## 2023-12-16 LAB — CLOSTRIDIUM DIFFICILE TOXIN B, QUALITATIVE, REAL-TIME PCR

## 2023-12-17 ENCOUNTER — Ambulatory Visit: Payer: Self-pay | Admitting: Gastroenterology

## 2023-12-17 LAB — CALPROTECTIN, FECAL: Calprotectin, Fecal: 52 ug/g (ref 0–120)

## 2024-01-01 ENCOUNTER — Other Ambulatory Visit: Payer: Self-pay

## 2024-01-01 ENCOUNTER — Other Ambulatory Visit

## 2024-01-01 DIAGNOSIS — R197 Diarrhea, unspecified: Secondary | ICD-10-CM

## 2024-01-01 NOTE — Progress Notes (Signed)
 lab

## 2024-01-01 NOTE — Telephone Encounter (Signed)
 Patient calling making us  aware they have turned in their labs.

## 2024-01-03 ENCOUNTER — Ambulatory Visit: Payer: Self-pay | Admitting: Gastroenterology

## 2024-01-03 LAB — CLOSTRIDIUM DIFFICILE BY PCR

## 2024-01-17 ENCOUNTER — Telehealth: Payer: Self-pay

## 2024-01-17 ENCOUNTER — Other Ambulatory Visit: Payer: Self-pay

## 2024-01-17 DIAGNOSIS — R197 Diarrhea, unspecified: Secondary | ICD-10-CM

## 2024-01-17 NOTE — Telephone Encounter (Signed)
 Patricia Huerta received a call from [facility] and they state that they are not sure what the PCR inhibitor is that is preventing the C diff from being ran but may be one of her meds.

## 2024-01-17 NOTE — Telephone Encounter (Signed)
 Stool test has been ordered and pt aware
